# Patient Record
Sex: Female | Born: 1960 | Race: White | Hispanic: No | Marital: Married | Smoking: Never smoker
Health system: Southern US, Community
[De-identification: ages and names within clinical notes are randomized; demographics above are authoritative.]

## PROBLEM LIST (undated history)

## (undated) DIAGNOSIS — I639 Cerebral infarction, unspecified: Secondary | ICD-10-CM

## (undated) DIAGNOSIS — F419 Anxiety disorder, unspecified: Secondary | ICD-10-CM

## (undated) DIAGNOSIS — K219 Gastro-esophageal reflux disease without esophagitis: Secondary | ICD-10-CM

## (undated) DIAGNOSIS — I10 Essential (primary) hypertension: Secondary | ICD-10-CM

## (undated) DIAGNOSIS — E785 Hyperlipidemia, unspecified: Secondary | ICD-10-CM

## (undated) DIAGNOSIS — D259 Leiomyoma of uterus, unspecified: Secondary | ICD-10-CM

## (undated) HISTORY — DX: Anxiety disorder, unspecified: F41.9

## (undated) HISTORY — DX: Cerebral infarction, unspecified: I63.9

## (undated) HISTORY — DX: Gastro-esophageal reflux disease without esophagitis: K21.9

## (undated) HISTORY — DX: Leiomyoma of uterus, unspecified: D25.9

## (undated) HISTORY — DX: Hyperlipidemia, unspecified: E78.5

## (undated) HISTORY — PX: CHOLECYSTECTOMY: SHX55

## (undated) HISTORY — DX: Essential (primary) hypertension: I10

---

## 2006-07-28 HISTORY — PX: CHOLECYSTECTOMY, LAPAROSCOPIC: SHX56

## 2012-01-27 HISTORY — PX: ABLATION: SHX5711

## 2012-07-31 ENCOUNTER — Other Ambulatory Visit (HOSPITAL_COMMUNITY): Payer: Self-pay | Admitting: Specialist

## 2012-07-31 DIAGNOSIS — Z1231 Encounter for screening mammogram for malignant neoplasm of breast: Secondary | ICD-10-CM

## 2012-08-18 ENCOUNTER — Ambulatory Visit (HOSPITAL_COMMUNITY): Payer: Self-pay | Attending: Specialist

## 2013-10-20 ENCOUNTER — Encounter: Payer: Self-pay | Admitting: Internal Medicine

## 2013-12-10 ENCOUNTER — Ambulatory Visit (AMBULATORY_SURGERY_CENTER): Payer: Self-pay

## 2013-12-10 VITALS — Ht 66.0 in | Wt 210.0 lb

## 2013-12-10 DIAGNOSIS — Z1211 Encounter for screening for malignant neoplasm of colon: Secondary | ICD-10-CM

## 2013-12-10 MED ORDER — MOVIPREP 100 G PO SOLR: 1.0000 | Freq: Once | ORAL | Status: DC

## 2013-12-22 ENCOUNTER — Ambulatory Visit (AMBULATORY_SURGERY_CENTER): Payer: 59 | Admitting: Internal Medicine

## 2013-12-22 ENCOUNTER — Encounter: Payer: Self-pay | Admitting: Internal Medicine

## 2013-12-22 VITALS — BP 116/66 | HR 70 | Temp 98.4°F | Resp 11 | Ht 66.0 in | Wt 210.0 lb

## 2013-12-22 DIAGNOSIS — D126 Benign neoplasm of colon, unspecified: Secondary | ICD-10-CM

## 2013-12-22 DIAGNOSIS — Z1211 Encounter for screening for malignant neoplasm of colon: Secondary | ICD-10-CM

## 2013-12-22 MED ORDER — SODIUM CHLORIDE 0.9 % IV SOLN: 500.0000 mL | INTRAVENOUS | Status: DC

## 2013-12-22 NOTE — Progress Notes (Signed)
Called to room to assist during endoscopic procedure.  Patient ID and intended procedure confirmed with present staff. Received instructions for my participation in the procedure from the performing physician.  

## 2013-12-22 NOTE — Progress Notes (Signed)
Report to pacu rn, vss, bbs=clear 

## 2013-12-22 NOTE — Patient Instructions (Signed)
YOU HAD AN ENDOSCOPIC PROCEDURE TODAY AT THE [facility]: Refer to the procedure report that was given to you for any specific questions about what was found during the examination.  If the procedure report does not answer your questions, please call your gastroenterologist to clarify.  If you requested that your care partner not be given the details of your procedure findings, then the procedure report has been included in a sealed envelope for you to review at your convenience later.  YOU SHOULD EXPECT: Some feelings of bloating in the abdomen. Passage of more gas than usual.  Walking can help get rid of the air that was put into your GI tract during the procedure and reduce the bloating. If you had a lower endoscopy (such as a colonoscopy or flexible sigmoidoscopy) you may notice spotting of blood in your stool or on the toilet paper. If you underwent a bowel prep for your procedure, then you may not have a normal bowel movement for a few days.  DIET: Your first meal following the procedure should be a light meal and then it is ok to progress to your normal diet.  A half-sandwich or bowl of soup is an example of a good first meal.  Heavy or fried foods are harder to digest and may make you feel nauseous or bloated.  Likewise meals heavy in dairy and vegetables can cause extra gas to form and this can also increase the bloating.  Drink plenty of fluids but you should avoid alcoholic beverages for 24 hours.  ACTIVITY: Your care partner should take you home directly after the procedure.  You should plan to take it easy, moving slowly for the rest of the day.  You can resume normal activity the day after the procedure however you should NOT DRIVE or use heavy machinery for 24 hours (because of the sedation medicines used during the test).    SYMPTOMS TO REPORT IMMEDIATELY: A gastroenterologist can be reached at any hour.  During normal business hours, 8:30 AM to 5:00 PM Monday through Friday,  call [phone].  After hours and on weekends, please call the GI answering service at [phone] who will take a message and have the physician on call contact you.   Following lower endoscopy (colonoscopy or flexible sigmoidoscopy):  Excessive amounts of blood in the stool  Significant tenderness or worsening of abdominal pains  Swelling of the abdomen that is new, acute  Fever of 100F or higher    FOLLOW UP: If any biopsies were taken you will be contacted by phone or by letter within the next 1-3 weeks.  Call your gastroenterologist if you have not heard about the biopsies in 3 weeks.  Our staff will call the home number listed on your records the next business day following your procedure to check on you and address any questions or concerns that you may have at that time regarding the information given to you following your procedure. This is a courtesy call and so if there is no answer at the home number and we have not heard from you through the emergency physician on call, we will assume that you have returned to your regular daily activities without incident.  SIGNATURES/CONFIDENTIALITY: You and/or your care partner have signed paperwork which will be entered into your electronic medical record.  These signatures attest to the fact that that the information above on your After Visit Summary has been reviewed and is understood.  Full responsibility of the confidentiality   of this discharge information lies with you and/or your care-partner.  Polyp and high fiber information given.  Dr. Olevia Perches will advise you about recall for next colonoscopy after pathology report is reviewed.

## 2013-12-22 NOTE — Op Note (Signed)
[place]  [address] [place], [address]   COLONOSCOPY PROCEDURE REPORT  PATIENT: Margaret Stanton, Margaret Stanton  MR#: [id number] BIRTHDATE: Dec 03, 1960 , 52  yrs. old GENDER: Female ENDOSCOPIST: Lafayette Dragon, MD REFERRED QT:MAUQ PROCEDURE DATE:  12/22/2013 PROCEDURE:   Colonoscopy with cold biopsy polypectomy First Screening Colonoscopy - Avg.  risk and is 50 yrs.  old or older Yes.  Prior Negative Screening - Now for repeat screening. N/A  History of Adenoma - Now for follow-up colonoscopy & has been > or = to 3 yrs.  N/A  Polyps Removed Today? Yes. ASA CLASS:   Class II INDICATIONS:Average risk patient for colon cancer. MEDICATIONS: MAC sedation, administered by CRNA and propofol (Diprivan) 300mg  IV  DESCRIPTION OF PROCEDURE:   After the risks benefits and alternatives of the procedure were thoroughly explained, informed consent was obtained.  A digital rectal exam revealed no abnormalities of the rectum.   The LB [id number]  endoscope was introduced through the anus and advanced to the cecum, which was identified by both the appendix and ileocecal valve. No adverse events experienced.   The quality of the prep was excellent, using MoviPrep  The instrument was then slowly withdrawn as the colon was fully examined.      COLON FINDINGS: Three diminutive smooth sessile polyps were found in the ascending colon.  A polypectomy was performed with cold forceps.  The resection was complete and the polyp tissue was completely retrieved.  Retroflexed views revealed no abnormalities. The time to cecum=7 minutes 49 seconds.  Withdrawal time=8 minutes 23 seconds.  The scope was withdrawn and the procedure completed. COMPLICATIONS: There were no complications.  ENDOSCOPIC IMPRESSION: Three diminutive sessile polyps were found in the ascending colon; polypectomy was performed with cold forceps  RECOMMENDATIONS: 1.  Await pathology results 2.  high-fiber diet Recall  colonoscopy pending path report   eSigned:  Lafayette Dragon, MD 12/22/2013 10:17 AM   cc:

## 2013-12-24 ENCOUNTER — Telehealth: Payer: Self-pay | Admitting: *Deleted

## 2013-12-24 NOTE — Telephone Encounter (Signed)
  Follow up Call-  Call back number 12/22/2013  Post procedure Call Back phone  # [phone]  Permission to leave phone message Yes     Patient questions:  Do you have a fever, pain , or abdominal swelling? no Pain Score  0 *  Have you tolerated food without any problems? yes  Have you been able to return to your normal activities? yes  Do you have any questions about your discharge instructions: Diet   no Medications  no Follow up visit  no  Do you have questions or concerns about your Care? no  Actions: * If pain score is 4 or above: No action needed, pain <4.

## 2013-12-29 ENCOUNTER — Encounter: Payer: Self-pay | Admitting: *Deleted

## 2013-12-29 ENCOUNTER — Encounter: Payer: Self-pay | Admitting: Internal Medicine

## 2014-03-30 DIAGNOSIS — I10 Essential (primary) hypertension: Secondary | ICD-10-CM | POA: Insufficient documentation

## 2014-04-12 ENCOUNTER — Encounter: Payer: Self-pay | Admitting: Family Medicine

## 2014-04-12 DIAGNOSIS — K222 Esophageal obstruction: Secondary | ICD-10-CM | POA: Insufficient documentation

## 2014-04-21 ENCOUNTER — Ambulatory Visit (INDEPENDENT_AMBULATORY_CARE_PROVIDER_SITE_OTHER): Payer: 59 | Admitting: Family Medicine

## 2014-04-21 ENCOUNTER — Encounter: Payer: Self-pay | Admitting: Family Medicine

## 2014-04-21 VITALS — BP 131/87 | HR 97 | Ht 66.0 in | Wt 207.0 lb

## 2014-04-21 DIAGNOSIS — Z8673 Personal history of transient ischemic attack (TIA), and cerebral infarction without residual deficits: Secondary | ICD-10-CM | POA: Insufficient documentation

## 2014-04-21 DIAGNOSIS — I10 Essential (primary) hypertension: Secondary | ICD-10-CM | POA: Insufficient documentation

## 2014-04-21 DIAGNOSIS — F3289 Other specified depressive episodes: Secondary | ICD-10-CM

## 2014-04-21 DIAGNOSIS — K2100 Gastro-esophageal reflux disease with esophagitis, without bleeding: Secondary | ICD-10-CM

## 2014-04-21 DIAGNOSIS — K219 Gastro-esophageal reflux disease without esophagitis: Secondary | ICD-10-CM | POA: Insufficient documentation

## 2014-04-21 DIAGNOSIS — F32A Depression, unspecified: Secondary | ICD-10-CM | POA: Insufficient documentation

## 2014-04-21 DIAGNOSIS — F3342 Major depressive disorder, recurrent, in full remission: Secondary | ICD-10-CM | POA: Insufficient documentation

## 2014-04-21 DIAGNOSIS — E785 Hyperlipidemia, unspecified: Secondary | ICD-10-CM

## 2014-04-21 DIAGNOSIS — F329 Major depressive disorder, single episode, unspecified: Secondary | ICD-10-CM

## 2014-04-21 LAB — BASIC METABOLIC PANEL WITH GFR
BUN: 11 mg/dL (ref 6–23)
CALCIUM: 9 mg/dL (ref 8.4–10.5)
CO2: 31 mEq/L (ref 19–32)
CREATININE: 0.8 mg/dL (ref 0.50–1.10)
Chloride: 102 mEq/L (ref 96–112)
GFR, Est Non African American: 86 mL/min
GLUCOSE: 89 mg/dL (ref 70–99)
Potassium: 4 mEq/L (ref 3.5–5.3)
SODIUM: 140 meq/L (ref 135–145)

## 2014-04-21 LAB — LIPID PANEL
Cholesterol: 231 mg/dL — ABNORMAL HIGH (ref 0–200)
HDL: 72 mg/dL (ref >39)
LDL CALC: 146 mg/dL — AB (ref 0–99)
Total CHOL/HDL Ratio: 3.2 Ratio
Triglycerides: 65 mg/dL (ref <150)
VLDL: 13 mg/dL (ref 0–40)

## 2014-04-21 MED ORDER — ASPIRIN 325 MG PO TBEC: 325.0000 mg | DELAYED_RELEASE_TABLET | Freq: Every day | ORAL | Status: DC

## 2014-04-21 MED ORDER — AMLODIPINE BESY-BENAZEPRIL HCL 5-10 MG PO CAPS: 1.0000 | ORAL_CAPSULE | Freq: Every day | ORAL | Status: DC

## 2014-04-21 MED ORDER — ESCITALOPRAM OXALATE 10 MG PO TABS: 10.0000 mg | ORAL_TABLET | Freq: Every day | ORAL | Status: DC

## 2014-04-21 NOTE — Progress Notes (Signed)
CC: Margaret Stanton is a 53 y.o. 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 Symptoms were present on a daily basis until starting Lexapro a little over a year ago. There's been no thoughts went to herself or others. There's been no sleep disturbance, anxiety.  Reports a history of GERD with a recent endoscopy showing a hiatal hernia and esophageal stricture earlier this summer. She was started on Protonix twice a day and since then has had complete resolution of epigastric discomfort starting 2 weeks ago. She denies any abdominal pain, regurgitation, nor difficulty swallowing  Reports a history of hypertension found when she was around 32. Strong family history of hypertension on both sides of the family tree. She's been on Lotrel for a matter of years now reports blood pressures at home are consistently below 140/90.  On chart review she has a history of hyperlipidemia most recently checked in 2012, for the last year she has not been on statin. Her past medical history significant for a CVA in 2012 without any residual neurologic deficit.  I reviewed the note from her neurologist who mentions she should be on a full aspirin but does not make any mention of being on a statin.. patient denies any recent motor or sensory disturbances  Review of Systems - General ROS: negative for - chills, fever, night sweats, weight gain or weight loss Ophthalmic ROS: negative for - decreased vision Psychological ROS: negative for - anxiety or depression ENT ROS: negative for - hearing change, nasal congestion, tinnitus or allergies Hematological and Lymphatic ROS: negative for - bleeding problems, bruising or swollen lymph nodes Breast ROS: negative Respiratory ROS: no cough,  shortness of breath, or wheezing Cardiovascular ROS: no chest pain or dyspnea on exertion Gastrointestinal ROS: no abdominal pain, change in bowel habits, or black or bloody stools Genito-Urinary ROS: negative for - genital discharge, genital ulcers, incontinence or abnormal bleeding from genitals Musculoskeletal ROS: negative for - joint pain or muscle pain Neurological ROS: negative for - headaches or memory loss Dermatological ROS: negative for lumps, mole changes, rash and skin lesion changes  Past Medical History  Diagnosis Date  . Hypertension   . CVA (cerebral infarction)     2013    Past Surgical History  Procedure Laterality Date  . Ablation  01-2012    uterine  . Cholecystectomy, laparoscopic  07-2006  . Cholecystectomy     Family History  Problem Relation Age of Onset  . Colon cancer Neg Hx   . Pancreatic cancer Neg Hx   . Rectal cancer Neg Hx   . Stomach cancer Neg Hx   . Heart attack Father   . Diabetes Mother   . Hypertension      parents    History   Social History  . Marital Status: Married    Spouse Name: N/A    Number of Children: N/A  . Years of Education: N/A   Occupational History  . Not on file.   Social History Main Topics  . Smoking status: Never Smoker   . Smokeless tobacco: Never Used  . Alcohol Use: Yes     Comment: 1 glass of wine about every 2 months  . Drug Use: No  . Sexual Activity: Yes    Partners: Male  Other Topics Concern  . Not on file   Social History Narrative  . No narrative on file     Objective: BP 131/87  Pulse 97  Ht 5\' 6"  (1.676 m)  Wt 207 lb (93.895 kg)  BMI 33.43 kg/m2  General: Alert and Oriented, No Acute Distress HEENT: Pupils equal, round, reactive to light. Conjunctivae clear.  Moist membranes pharynx unremarkable Lungs: Clear to auscultation bilaterally, no wheezing/ronchi/rales.  Comfortable work of breathing. Good air movement. Cardiac: Regular rate and rhythm. Normal S1/S2.  No murmurs, rubs,  nor gallops.  No carotid bruit Abdomen: Obese and soft Extremities: No peripheral edema.  Strong peripheral pulses.  Mental Status: No depression, anxiety, nor agitation. Skin: Warm and dry.  Assessment & Plan: Margaret Stanton was seen today for establish care.  Diagnoses and associated orders for this visit:  Depression - escitalopram (LEXAPRO) 10 MG tablet; Take 1 tablet (10 mg total) by mouth daily.  Gastroesophageal reflux disease with esophagitis  Essential hypertension, benign - BASIC METABOLIC PANEL WITH GFR - aspirin 325 MG EC tablet; Take 1 tablet (325 mg total) by mouth daily. - amLODipine-benazepril (LOTREL) 5-10 MG per capsule; Take 1 capsule by mouth daily.  Hyperlipidemia LDL goal <100 - Lipid panel  History of CVA (cerebrovascular accident) without residual deficits    Depression: Controlled continue Lexapro GERD: Controlled continue Protonix twice a day Essential hypertension: Controlled checking renal function, continue Lotrel pending results Hyperlipidemia: Uncontrolled as of last cholesterol panel, recheck today History of CVA: Requesting further outside records to determine why she is not on a statin. Continue aspirin She's due for routine diabetic screening  Return in about 6 months (around 10/21/2014) for BP and Cholesterol Followup.

## 2014-04-22 ENCOUNTER — Telehealth: Payer: Self-pay | Admitting: Family Medicine

## 2014-04-22 DIAGNOSIS — E785 Hyperlipidemia, unspecified: Secondary | ICD-10-CM

## 2014-04-22 MED ORDER — ATORVASTATIN CALCIUM 20 MG PO TABS: 20.0000 mg | ORAL_TABLET | Freq: Every day | ORAL | Status: DC

## 2014-04-22 NOTE — Telephone Encounter (Signed)
Pt.notified

## 2014-04-22 NOTE — Telephone Encounter (Signed)
Seth Bake, Will you please let patient know that her fasting blood sugar and kidney function were normal.  Her LDL cholesterol was elevated at 146 with a goal of less than 100 given her history of a stroke.  Based on current guidelines I would recommend she start a daily cholesterol lowering medication called atorvastatin which I've sent to the [facility] outpatient pharmacy.  I'd encourage f/u in 3 months to recheck cholesterol.

## 2014-05-02 ENCOUNTER — Encounter: Payer: Self-pay | Admitting: Family Medicine

## 2014-05-02 DIAGNOSIS — G43909 Migraine, unspecified, not intractable, without status migrainosus: Secondary | ICD-10-CM | POA: Insufficient documentation

## 2014-05-13 LAB — HM MAMMOGRAPHY

## 2014-09-13 ENCOUNTER — Encounter: Payer: Self-pay | Admitting: Family Medicine

## 2014-09-13 DIAGNOSIS — K831 Obstruction of bile duct: Secondary | ICD-10-CM | POA: Insufficient documentation

## 2014-09-13 DIAGNOSIS — D3502 Benign neoplasm of left adrenal gland: Secondary | ICD-10-CM | POA: Insufficient documentation

## 2014-11-08 ENCOUNTER — Ambulatory Visit: Payer: 59 | Attending: Orthopedic Surgery | Admitting: Physical Therapy

## 2014-11-08 DIAGNOSIS — M25562 Pain in left knee: Secondary | ICD-10-CM | POA: Diagnosis present

## 2014-11-15 ENCOUNTER — Ambulatory Visit: Payer: 59 | Admitting: Physical Therapy

## 2014-11-15 DIAGNOSIS — M25562 Pain in left knee: Secondary | ICD-10-CM | POA: Diagnosis not present

## 2014-11-21 ENCOUNTER — Ambulatory Visit: Payer: 59 | Admitting: Physical Therapy

## 2014-11-21 DIAGNOSIS — M25562 Pain in left knee: Secondary | ICD-10-CM | POA: Diagnosis not present

## 2014-11-25 ENCOUNTER — Ambulatory Visit: Payer: 59 | Admitting: Physical Therapy

## 2015-02-07 ENCOUNTER — Encounter: Payer: Self-pay | Admitting: Family Medicine

## 2015-02-07 ENCOUNTER — Telehealth: Payer: Self-pay | Admitting: Family Medicine

## 2015-02-07 DIAGNOSIS — I10 Essential (primary) hypertension: Secondary | ICD-10-CM

## 2015-02-07 MED ORDER — AMLODIPINE BESY-BENAZEPRIL HCL 5-10 MG PO CAPS: 1.0000 | ORAL_CAPSULE | Freq: Every day | ORAL | Status: DC

## 2015-02-07 NOTE — Telephone Encounter (Signed)
Message left on vm 

## 2015-02-07 NOTE — Telephone Encounter (Signed)
Margaret Stanton, Will you please let patient know that I sent in enough Lotrel to last her until her follow up in June.

## 2015-04-24 ENCOUNTER — Encounter: Payer: Self-pay | Admitting: *Deleted

## 2015-05-15 ENCOUNTER — Ambulatory Visit (INDEPENDENT_AMBULATORY_CARE_PROVIDER_SITE_OTHER): Payer: 59 | Admitting: Family Medicine

## 2015-05-15 ENCOUNTER — Encounter: Payer: Self-pay | Admitting: Family Medicine

## 2015-05-15 VITALS — BP 125/89 | HR 88 | Ht 66.0 in | Wt 216.0 lb

## 2015-05-15 DIAGNOSIS — I10 Essential (primary) hypertension: Secondary | ICD-10-CM

## 2015-05-15 DIAGNOSIS — E785 Hyperlipidemia, unspecified: Secondary | ICD-10-CM

## 2015-05-15 DIAGNOSIS — Z8673 Personal history of transient ischemic attack (TIA), and cerebral infarction without residual deficits: Secondary | ICD-10-CM | POA: Diagnosis not present

## 2015-05-15 DIAGNOSIS — E669 Obesity, unspecified: Secondary | ICD-10-CM

## 2015-05-15 DIAGNOSIS — F32A Depression, unspecified: Secondary | ICD-10-CM

## 2015-05-15 DIAGNOSIS — K2100 Gastro-esophageal reflux disease with esophagitis, without bleeding: Secondary | ICD-10-CM

## 2015-05-15 DIAGNOSIS — F329 Major depressive disorder, single episode, unspecified: Secondary | ICD-10-CM

## 2015-05-15 LAB — LIPID PANEL
CHOLESTEROL: 220 mg/dL — AB (ref 0–200)
HDL: 71 mg/dL (ref >=46)
LDL CALC: 137 mg/dL — AB (ref 0–99)
TRIGLYCERIDES: 59 mg/dL (ref <150)
Total CHOL/HDL Ratio: 3.1 Ratio
VLDL: 12 mg/dL (ref 0–40)

## 2015-05-15 MED ORDER — PANTOPRAZOLE SODIUM 40 MG PO TBEC: 40.0000 mg | DELAYED_RELEASE_TABLET | Freq: Two times a day (BID) | ORAL | Status: DC

## 2015-05-15 MED ORDER — TOPIRAMATE 25 MG PO TABS: 25.0000 mg | ORAL_TABLET | Freq: Two times a day (BID) | ORAL | Status: DC

## 2015-05-15 MED ORDER — AMLODIPINE BESY-BENAZEPRIL HCL 5-10 MG PO CAPS: 1.0000 | ORAL_CAPSULE | Freq: Every day | ORAL | Status: DC

## 2015-05-15 MED ORDER — ESCITALOPRAM OXALATE 10 MG PO TABS: 10.0000 mg | ORAL_TABLET | Freq: Every day | ORAL | Status: DC

## 2015-05-15 NOTE — Progress Notes (Signed)
CC: Margaret Stanton is a 54 y.o. female is here for Follow-up   Subjective: HPI:  FU HTN: Continues to take lotrel on a daily basis w/out side effects.  No outside BPs to report.  No chest pain, shortness of breath, orthopnea, peripheral edema, nor motor or sensory disturbances.  FU HLD: continues to take lipitor on a daily basis. no RUQ pain nor myalgias. No limb claudication.  No formal exercise routine.  FU CVA: No new motor or sensory disturbances. Taking aspirin daily  FU GERD: REquesting refill on protonix.  Since esophageal dilatation last year no chest discomfort, dysphagia, nor suspicion of reflux/    FU Depression: Feels like lexapro is still working.  Stressed from work but not [employer].  No thoughts of wanting to harm self or others.   Wants to know if there is anything she can take to help reduce appetite. Not having success with this on her own.   Review Of Systems Outlined In HPI  Past Medical History  Diagnosis Date  . Hypertension   . CVA (cerebral infarction)     2013  . Uterine fibromyoma     Past Surgical History  Procedure Laterality Date  . Ablation  01-2012    uterine  . Cholecystectomy, laparoscopic  07-2006  . Cholecystectomy     Family History  Problem Relation Age of Onset  . Colon cancer Neg Hx   . Pancreatic cancer Neg Hx   . Rectal cancer Neg Hx   . Stomach cancer Neg Hx   . Heart attack Father   . Diabetes Mother   . Hypertension      parents    History   Social History  . Marital Status: Married    Spouse Name: N/A  . Number of Children: N/A  . Years of Education: N/A   Occupational History  . Not on file.   Social History Main Topics  . Smoking status: Never Smoker   . Smokeless tobacco: Never Used  . Alcohol Use: Yes     Comment: 1 glass of wine about every 2 months  . Drug Use: No  . Sexual Activity:    Partners: Male   Other Topics Concern  . Not on file   Social History Narrative  . No  narrative on file     Objective: BP 125/89 mmHg  Pulse 88  Ht 5\' 6"  (1.676 m)  Wt 216 lb (97.977 kg)  BMI 34.88 kg/m2  General: Alert and Oriented, No Acute Distress HEENT: Pupils equal, round, reactive to light. Conjunctivae clear.  Moist mucous membranes Lungs: Clear to auscultation bilaterally, no wheezing/ronchi/rales.  Comfortable work of breathing. Good air movement. Cardiac: Regular rate and rhythm. Normal S1/S2.  No murmurs, rubs, nor gallops.   Abdomen: Normal bowel sounds, soft and non tender without palpable masses. Extremities: No peripheral edema.  Strong peripheral pulses.  Mental Status: No depression, anxiety, nor agitation. Skin: Warm and dry.  Assessment & Plan: Margaret Stanton was seen today for follow-up.  Diagnoses and all orders for this visit:  Essential hypertension, benign Orders: -     amLODipine-benazepril (LOTREL) 5-10 MG per capsule; Take 1 capsule by mouth daily.  Hyperlipidemia LDL goal <100 Orders: -     Lipid panel  History of CVA (cerebrovascular accident) without residual deficits  Gastroesophageal reflux disease with esophagitis Orders: -     pantoprazole (PROTONIX) 40 MG tablet; Take 1 tablet (40 mg total) by mouth 2 (two) times daily.  Depression Orders: -     escitalopram (LEXAPRO) 10 MG tablet; Take 1 tablet (10 mg total) by mouth daily.  Obesity Orders: -     topiramate (TOPAMAX) 25 MG tablet; Take 1 tablet (25 mg total) by mouth 2 (two) times daily. To help with appetite suppression.   HTN: Controlled with lotrel HLD: Due for repeat lipid panel, continue lipitor pending results CVA: No residual deficits.  Continue HTN control and daily aspirin GERD: controlled with protonix Depression: Controlled with lexapro Obesity: Not a great candidate for phentermine therefore start low dose topamax.  Return in about 3 months (around 08/15/2015) for BP Follow Up.

## 2015-05-16 ENCOUNTER — Telehealth: Payer: Self-pay | Admitting: Family Medicine

## 2015-05-16 MED ORDER — ATORVASTATIN CALCIUM 40 MG PO TABS: 40.0000 mg | ORAL_TABLET | Freq: Every day | ORAL | Status: DC

## 2015-05-16 NOTE — Telephone Encounter (Signed)
Margaret Stanton, Will you please let patient know that her LDL cholesterol was 137 and I would recommend a goal of less than 100. To help achieve this I will send in a larger dose of atorvastatin to [facility].  The new dose is 40mg  daily.  I'd recommend a recheck in three months.

## 2015-05-16 NOTE — Telephone Encounter (Signed)
Pt.notified

## 2015-08-17 ENCOUNTER — Encounter: Payer: Self-pay | Admitting: Family Medicine

## 2015-08-17 ENCOUNTER — Ambulatory Visit (INDEPENDENT_AMBULATORY_CARE_PROVIDER_SITE_OTHER): Payer: 59 | Admitting: Family Medicine

## 2015-08-17 ENCOUNTER — Ambulatory Visit (INDEPENDENT_AMBULATORY_CARE_PROVIDER_SITE_OTHER): Payer: 59

## 2015-08-17 VITALS — BP 140/86 | HR 88 | Wt 218.0 lb

## 2015-08-17 DIAGNOSIS — S20212A Contusion of left front wall of thorax, initial encounter: Secondary | ICD-10-CM

## 2015-08-17 DIAGNOSIS — R0781 Pleurodynia: Secondary | ICD-10-CM

## 2015-08-17 HISTORY — DX: Contusion of left front wall of thorax, initial encounter: S20.212A

## 2015-08-17 NOTE — Progress Notes (Signed)
Margaret Stanton is a 54 y.o. female who presents to [facility]: [facility]  today for rib injury. Patient was in her normal state of health 3 days ago when she tripped and fell at work. She was holding a clipboard. The clipboard June due to her ribs when she fell. She notes pain in the left anterior ribs. This is worse when she lies flat on her back and with motion. She denies any shortness of breath fevers chills nausea vomiting or diarrhea. She's tried some over-the-counter medicines for pain which have helped some.   Past Medical History  Diagnosis Date  . Hypertension   . CVA (cerebral infarction)     2013  . Uterine fibromyoma    Past Surgical History  Procedure Laterality Date  . Ablation  01-2012    uterine  . Cholecystectomy, laparoscopic  07-2006  . Cholecystectomy     Social History  Substance Use Topics  . Smoking status: Never Smoker   . Smokeless tobacco: Never Used  . Alcohol Use: Yes     Comment: 1 glass of wine about every 2 months   family history includes Diabetes in her mother; Heart attack in her father; Hypertension in an other family member. There is no history of Colon cancer, Pancreatic cancer, Rectal cancer, or Stomach cancer.  ROS as above Medications: Current Outpatient Prescriptions  Medication Sig Dispense Refill  . amLODipine-benazepril (LOTREL) 5-10 MG per capsule Take 1 capsule by mouth daily. 90 capsule 3  . aspirin 325 MG EC tablet Take 1 tablet (325 mg total) by mouth daily. 90 tablet 3  . atorvastatin (LIPITOR) 40 MG tablet Take 1 tablet (40 mg total) by mouth daily. 90 tablet 1  . escitalopram (LEXAPRO) 10 MG tablet Take 1 tablet (10 mg total) by mouth daily. 90 tablet 3  . pantoprazole (PROTONIX) 40 MG tablet Take 1 tablet (40 mg total) by mouth 2 (two) times daily. 60 tablet 3   No current facility-administered medications for this visit.   No Known Allergies   Exam:  BP 140/86 mmHg  Pulse 88  Wt 218 lb (98.884  kg) Gen: Well NAD HEENT: EOMI,  MMM Lungs: Normal work of breathing. CTABL Chest wall: Tender palpation left inferior anterior ribs  Heart: RRR no MRG Abd: NABS, Soft. Nondistended, Nontender Exts: Brisk capillary refill, warm and well perfused.   Preliminary x-ray of left ribs no fractures. Awaiting formal radiology review.  No results found for this or any previous visit (from the past 24 hour(s)). No results found.   Please see individual assessment and plan sections.

## 2015-08-17 NOTE — Assessment & Plan Note (Signed)
Contusion. Awaiting x-ray results. Provided patient with incentive spirometer. She will continue ibuprofen at prescription strength doses as needed. Return with PCP as needed.

## 2015-08-17 NOTE — Patient Instructions (Signed)
Thank you for coming in today. Call or go to the emergency room if you get worse, have trouble breathing, have chest pains, or palpitations.  Use the incentive spirometer.  Return as needed.   Rib Contusion A rib contusion is a deep bruise on your rib area. Contusions are the result of a blunt trauma that causes bleeding and injury to the tissues under the skin. A rib contusion may involve bruising of the ribs and of the skin and muscles in the area. The skin overlying the contusion may turn blue, purple, or yellow. Minor injuries will give you a painless contusion, but more severe contusions may stay painful and swollen for a few weeks. CAUSES  A contusion is usually caused by a blow, trauma, or direct force to an area of the body. This often occurs while playing contact sports. SYMPTOMS  Swelling and redness of the injured area.  Discoloration of the injured area.  Tenderness and soreness of the injured area.  Pain with or without movement. DIAGNOSIS  The diagnosis can be made by taking a medical history and performing a physical exam. An X-ray, CT scan, or MRI may be needed to determine if there were any associated injuries, such as broken bones (fractures) or internal injuries. TREATMENT  Often, the best treatment for a rib contusion is rest. Icing or applying cold compresses to the injured area may help reduce swelling and inflammation. Deep breathing exercises may be recommended to reduce the risk of partial lung collapse and pneumonia. Over-the-counter or prescription medicines may also be recommended for pain control. HOME CARE INSTRUCTIONS   Apply ice to the injured area:  Put ice in a plastic bag.  Place a towel between your skin and the bag.  Leave the ice on for 20 minutes, 2-3 times per day.  Take medicines only as directed by your health care provider.  Rest the injured area. Avoid strenuous activity and any activities or movements that cause pain. Be careful during  activities and avoid bumping the injured area.  Perform deep-breathing exercises as directed by your health care provider.  Do not lift anything that is heavier than 5 lb (2.3 kg) until your health care provider approves.  Do not use any tobacco products, including cigarettes, chewing tobacco, or electronic cigarettes. If you need help quitting, ask your health care provider. SEEK MEDICAL CARE IF:   You have increased bruising or swelling.  You have pain that is not controlled with treatment.  You have a fever. SEEK IMMEDIATE MEDICAL CARE IF:   You have difficulty breathing or shortness of breath.  You develop a continual cough, or you cough up thick or bloody sputum.  You feel sick to your stomach (nauseous), you throw up (vomit), or you have abdominal pain.   This information is not intended to replace advice given to you by your health care provider. Make sure you discuss any questions you have with your health care provider.   Document Released: 07/09/2001 Document Revised: 11/04/2014 Document Reviewed: 07/26/2014 Elsevier Interactive Patient Education [employer].

## 2015-08-18 NOTE — Progress Notes (Signed)
Quick Note:  Rib xray shows no fracture ______

## 2015-09-25 ENCOUNTER — Ambulatory Visit (INDEPENDENT_AMBULATORY_CARE_PROVIDER_SITE_OTHER): Payer: 59 | Admitting: Family Medicine

## 2015-09-25 ENCOUNTER — Encounter: Payer: Self-pay | Admitting: Family Medicine

## 2015-09-25 VITALS — BP 124/89 | HR 114 | Temp 98.9°F | Wt 216.0 lb

## 2015-09-25 DIAGNOSIS — A499 Bacterial infection, unspecified: Secondary | ICD-10-CM | POA: Diagnosis not present

## 2015-09-25 DIAGNOSIS — B9689 Other specified bacterial agents as the cause of diseases classified elsewhere: Secondary | ICD-10-CM

## 2015-09-25 DIAGNOSIS — J329 Chronic sinusitis, unspecified: Secondary | ICD-10-CM

## 2015-09-25 MED ORDER — AMOXICILLIN-POT CLAVULANATE 500-125 MG PO TABS: ORAL_TABLET | ORAL | Status: AC

## 2015-09-25 NOTE — Progress Notes (Signed)
CC: Margaret Stanton is a 54 y.o. female is here for Sinusitis; Sore Throat; Ear Fullness; and Generalized Body Aches   Subjective: HPI:  Facial pressure in the forehead along with nasal congestion and postnasal drip. Symptoms of an present for 2 weeks and last night she developed chills, body aches and a sore throat. No interventions as of yet. Symptoms are worsening on daily basis. Worse first thing in the morning and late in the evening. Denies confusion, rash, difficulty swallowing, cough or shortness of breath.  Review Of Systems Outlined In HPI  Past Medical History  Diagnosis Date  . Hypertension   . CVA (cerebral infarction)     2013  . Uterine fibromyoma     Past Surgical History  Procedure Laterality Date  . Ablation  01-2012    uterine  . Cholecystectomy, laparoscopic  07-2006  . Cholecystectomy     Family History  Problem Relation Age of Onset  . Colon cancer Neg Hx   . Pancreatic cancer Neg Hx   . Rectal cancer Neg Hx   . Stomach cancer Neg Hx   . Heart attack Father   . Diabetes Mother   . Hypertension      parents    Social History   Social History  . Marital Status: Married    Spouse Name: N/A  . Number of Children: N/A  . Years of Education: N/A   Occupational History  . Not on file.   Social History Main Topics  . Smoking status: Never Smoker   . Smokeless tobacco: Never Used  . Alcohol Use: Yes     Comment: 1 glass of wine about every 2 months  . Drug Use: No  . Sexual Activity:    Partners: Male   Other Topics Concern  . Not on file   Social History Narrative     Objective: BP 124/89 mmHg  Pulse 114  Temp(Src) 98.9 F (37.2 C) (Oral)  Wt 216 lb (97.977 kg)  General: Alert and Oriented, No Acute Distress HEENT: Pupils equal, round, reactive to light. Conjunctivae clear.  External ears unremarkable, canals clear with intact TMs with appropriate landmarks.  Middle ear appears open without effusion. Pink inferior turbinates.  Moist  mucous membranes, pharynx without inflammation nor lesions.  Neck supple without palpable lymphadenopathy nor abnormal masses. Lungs: Clear to auscultation bilaterally, no wheezing/ronchi/rales.  Comfortable work of breathing. Good air movement. Extremities: No peripheral edema.  Strong peripheral pulses.  Mental Status: No depression, anxiety, nor agitation. Skin: Warm and dry.  Assessment & Plan: Lazette was seen today for sinusitis, sore throat, ear fullness and generalized body aches.  Diagnoses and all orders for this visit:  Bacterial sinusitis -     amoxicillin-clavulanate (AUGMENTIN) 500-125 MG tablet; Take one by mouth every 8 hours for ten total days.   Start Augmentin, call if no better by Wednesday, consider starting nasal saline washes.  Return if symptoms worsen or fail to improve.

## 2015-11-22 DIAGNOSIS — R928 Other abnormal and inconclusive findings on diagnostic imaging of breast: Secondary | ICD-10-CM | POA: Diagnosis not present

## 2015-11-22 DIAGNOSIS — N63 Unspecified lump in breast: Secondary | ICD-10-CM | POA: Diagnosis not present

## 2015-11-22 DIAGNOSIS — N6002 Solitary cyst of left breast: Secondary | ICD-10-CM | POA: Diagnosis not present

## 2015-11-22 DIAGNOSIS — N6001 Solitary cyst of right breast: Secondary | ICD-10-CM | POA: Diagnosis not present

## 2015-12-11 DIAGNOSIS — N63 Unspecified lump in breast: Secondary | ICD-10-CM | POA: Diagnosis not present

## 2015-12-11 DIAGNOSIS — N6001 Solitary cyst of right breast: Secondary | ICD-10-CM | POA: Diagnosis not present

## 2015-12-14 ENCOUNTER — Other Ambulatory Visit: Payer: Self-pay | Admitting: Family Medicine

## 2015-12-14 MED FILL — ESCITALOPRAM 10 MG TABLET: 10 | 90 days supply | Qty: 90 | Fill #2

## 2015-12-14 MED FILL — AMLODIPINE-BENAZEPRIL 5-10: 5-10 | 90 days supply | Qty: 90 | Fill #2

## 2016-02-23 MED FILL — PANTOPRAZOLE SOD DR 40 MG T: 40 | 90 days supply | Qty: 180 | Fill #0

## 2016-03-15 MED FILL — ESCITALOPRAM 10 MG TABLET: 10 | 90 days supply | Qty: 90 | Fill #3

## 2016-03-15 MED FILL — AMLODIPINE-BENAZEPRIL 5-10: 5-10 | 90 days supply | Qty: 90 | Fill #3

## 2016-04-17 ENCOUNTER — Ambulatory Visit (INDEPENDENT_AMBULATORY_CARE_PROVIDER_SITE_OTHER): Payer: 59 | Admitting: Family Medicine

## 2016-04-17 ENCOUNTER — Encounter: Payer: Self-pay | Admitting: Family Medicine

## 2016-04-17 VITALS — BP 128/84 | HR 76 | Wt 188.0 lb

## 2016-04-17 DIAGNOSIS — I10 Essential (primary) hypertension: Secondary | ICD-10-CM

## 2016-04-17 DIAGNOSIS — L918 Other hypertrophic disorders of the skin: Secondary | ICD-10-CM | POA: Diagnosis not present

## 2016-04-17 DIAGNOSIS — L089 Local infection of the skin and subcutaneous tissue, unspecified: Secondary | ICD-10-CM

## 2016-04-17 MED ORDER — AMLODIPINE BESY-BENAZEPRIL HCL 2.5-10 MG PO CAPS: 1.0000 | ORAL_CAPSULE | Freq: Every day | ORAL | Status: DC

## 2016-04-17 MED FILL — AMLODIPINE-BENAZEPRIL 2.5-1: 2.5-10 | 30 days supply | Qty: 30 | Fill #0

## 2016-04-17 NOTE — Progress Notes (Signed)
CC: Margaret Stanton is a 55 y.o. female is here for skin tags and Dizziness   Subjective: HPI:  Follow-up essential hypertension: She's lost 30 pounds intentionally over the last 3-4 months. She is doing weight watchers. She notices if she gets up quickly she gets lightheaded. It's causing dizziness. It never happens in any other situation and his mild severity. She's taking her Lotrel with 100% compliance. No outside blood pressures to report. Denies chest pain shortness of breath or any other motor or sensory disturbances  She's got some skin tags on her neck, left torso and on her right forearm. The tags on her neck and left torso caused intolerable pain when wearing a shirt. No interventions as of yet. They are getting larger on the monthly basis. Denies any bleeding or any other skin changes.   Review Of Systems Outlined In HPI  Past Medical History  Diagnosis Date  . Hypertension   . CVA (cerebral infarction)     2013  . Uterine fibromyoma     Past Surgical History  Procedure Laterality Date  . Ablation  01-2012    uterine  . Cholecystectomy, laparoscopic  07-2006  . Cholecystectomy     Family History  Problem Relation Age of Onset  . Colon cancer Neg Hx   . Pancreatic cancer Neg Hx   . Rectal cancer Neg Hx   . Stomach cancer Neg Hx   . Heart attack Father   . Diabetes Mother   . Hypertension      parents    Social History   Social History  . Marital Status: Married    Spouse Name: N/A  . Number of Children: N/A  . Years of Education: N/A   Occupational History  . Not on file.   Social History Main Topics  . Smoking status: Never Smoker   . Smokeless tobacco: Never Used  . Alcohol Use: Yes     Comment: 1 glass of wine about every 2 months  . Drug Use: No  . Sexual Activity:    Partners: Male   Other Topics Concern  . Not on file   Social History Narrative     Objective: BP 128/84 mmHg  Pulse 76  Wt 188 lb (85.276 kg)  Vital signs  reviewed. General: Alert and Oriented, No Acute Distress HEENT: Pupils equal, round, reactive to light. Conjunctivae clear.  External ears unremarkable.  Moist mucous membranes. Lungs: Clear and comfortable work of breathing, speaking in full sentences without accessory muscle use. Cardiac: Regular rate and rhythm.  Neuro: CN II-XII grossly intact, gait normal. Extremities: No peripheral edema.  Strong peripheral pulses.  Mental Status: No depression, anxiety, nor agitation. Logical though process. Skin: Warm and dry. Inflamed skin tag on the left side of the neck and on the left torso, noninflamed skin tags on the right forearm  Assessment & Plan: Margaret Stanton was seen today for skin tags and dizziness.  Diagnoses and all orders for this visit:  Essential hypertension, benign  Inflamed skin tag  Other orders -     amlodipine-benazepril (LOTREL) 2.5-10 MG capsule; Take 1 capsule by mouth daily.   Essential hypertension: Controlled however a little too aggressive with her dose therefore cutting back in reevaluating this in 4 weeks and she has follow-up. Skin tags: I offered cryotherapy versus excision she would like cryotherapy to the small skin tags and shave biopsy to the large one on her torso.   Cryotherapy Procedure Note  Pre-operative Diagnosis: skin tag  Post-operative Diagnosis: same  Locations: {left neck  Indications: pain  Anesthesia: None  Procedure Details  History of allergy to iodine: no. Pacemaker? no.  Patient informed of risks (permanent scarring, infection, light or dark discoloration, bleeding, infection, weakness, numbness and recurrence of the lesion) and benefits of the procedure and verbal informed consent obtained.  The areas are treated with liquid nitrogen therapy, frozen until ice ball extended 2 mm beyond lesion, allowed to thaw, and treated again. The patient tolerated procedure well.  The patient was instructed on post-op care, warned that there may  be blister formation, redness and pain. Recommend OTC analgesia as needed for pain.  Condition: Stable  Complications: none.  Plan: 1. Instructed to keep the area dry and covered for 24-48h and clean thereafter. 2. Warning signs of infection were reviewed.   3. Recommended that the patient use OTC analgesics as needed for pain.  4. Return PRN  Shave Biopsy Procedure Note  Pre-operative Diagnosis: inflammed skin tag  Post-operative Diagnosis: same  Locations:left flank  Indications: pain  Anesthesia: Aerosol cold spray  Procedure Details  History of allergy to iodine: no  Patient informed of the risks (including bleeding and infection) and benefits of the  procedure and Verbal informed consent obtained.  The lesion and surrounding area were given a sterile prep using chlorhexidine and draped in the usual sterile fashion. A scalpel was used to shave an area of skin approximately 0.6cm by 0.1cm.  Hemostasis achieved with alumuninum chloride. Antibiotic ointment and a sterile dressing applied.  The specimen was not sent for pathologic examination. The patient tolerated the procedure well.  EBL: 1 ml  Findings: none  Condition: Stable  Complications: none.  Plan: 1. Instructed to keep the wound dry and covered for 24-48h and clean thereafter. 2. Warning signs of infection were reviewed.   3. Recommended that the patient use OTC analgesics as needed for pain.  4. Return PRN  25 minutes spent face-to-face during visit today of which at least 50% was counseling or coordinating care regarding: 1. Essential hypertension, benign   2. Inflamed skin tag     Return if symptoms worsen or fail to improve.

## 2016-05-09 DIAGNOSIS — H5213 Myopia, bilateral: Secondary | ICD-10-CM | POA: Diagnosis not present

## 2016-05-09 DIAGNOSIS — H524 Presbyopia: Secondary | ICD-10-CM | POA: Diagnosis not present

## 2016-05-13 ENCOUNTER — Ambulatory Visit (INDEPENDENT_AMBULATORY_CARE_PROVIDER_SITE_OTHER): Payer: 59 | Admitting: Family Medicine

## 2016-05-13 ENCOUNTER — Encounter: Payer: Self-pay | Admitting: Family Medicine

## 2016-05-13 VITALS — BP 124/84 | HR 69 | Wt 185.0 lb

## 2016-05-13 DIAGNOSIS — F32A Depression, unspecified: Secondary | ICD-10-CM

## 2016-05-13 DIAGNOSIS — Z0000 Encounter for general adult medical examination without abnormal findings: Secondary | ICD-10-CM | POA: Diagnosis not present

## 2016-05-13 DIAGNOSIS — F329 Major depressive disorder, single episode, unspecified: Secondary | ICD-10-CM | POA: Diagnosis not present

## 2016-05-13 MED ORDER — PANTOPRAZOLE SODIUM 40 MG PO TBEC
DELAYED_RELEASE_TABLET | ORAL | Status: DC
Start: 2016-05-13 — End: 2017-01-20

## 2016-05-13 MED ORDER — AMLODIPINE BESY-BENAZEPRIL HCL 2.5-10 MG PO CAPS: 1.0000 | ORAL_CAPSULE | Freq: Every day | ORAL | Status: DC

## 2016-05-13 MED ORDER — ESCITALOPRAM OXALATE 10 MG PO TABS: 10.0000 mg | ORAL_TABLET | Freq: Every day | ORAL | Status: DC

## 2016-05-13 MED ORDER — ATORVASTATIN CALCIUM 40 MG PO TABS: 40.0000 mg | ORAL_TABLET | Freq: Every day | ORAL | Status: DC

## 2016-05-13 MED FILL — PANTOPRAZOLE SOD DR 40 MG T: 40 | 90 days supply | Qty: 180 | Fill #0

## 2016-05-13 MED FILL — AMLODIPINE-BENAZEPRIL 2.5-1: 2.5-10 | 90 days supply | Qty: 90 | Fill #0

## 2016-05-13 MED FILL — ESCITALOPRAM 10 MG TABLET: 10 | 90 days supply | Qty: 90 | Fill #0

## 2016-05-13 NOTE — Progress Notes (Signed)
CC: Margaret Stanton is a 55 y.o. female is here for Annual Exam   Subjective: HPI:  Colonoscopy: Due for repeat 2025 Papsmear: UTD Mammogram: Due for repeat US and Mammo 05/2016  Influenza Vaccine: No current indication Pneumovax: No current indication Td/Tdap:March 28th 2012 UTD Zoster: (Start 53 yo)   Requesting complete physical exam, ever since changing her blood pressure medication she's no longer having any lightheadedness.  Review of Systems - General ROS: negative for - chills, fever, night sweats, weight gain or weight loss Ophthalmic ROS: negative for - decreased vision Psychological ROS: negative for - anxiety or depression ENT ROS: negative for - hearing change, nasal congestion, tinnitus or allergies Hematological and Lymphatic ROS: negative for - bleeding problems, bruising or swollen lymph nodes Breast ROS: negative Respiratory ROS: no cough, shortness of breath, or wheezing Cardiovascular ROS: no chest pain or dyspnea on exertion Gastrointestinal ROS: no abdominal pain, change in bowel habits, or black or bloody stools Genito-Urinary ROS: negative for - genital discharge, genital ulcers, incontinence or abnormal bleeding from genitals Musculoskeletal ROS: negative for - joint pain or muscle pain Neurological ROS: negative for - headaches or memory loss Dermatological ROS: negative for lumps, mole changes, rash and skin lesion changes   Review Of Systems Outlined In HPI  Past Medical History  Diagnosis Date  . Hypertension   . CVA (cerebral infarction)     2013  . Uterine fibromyoma     Past Surgical History  Procedure Laterality Date  . Ablation  01-2012    uterine  . Cholecystectomy, laparoscopic  07-2006  . Cholecystectomy     Family History  Problem Relation Age of Onset  . Colon cancer Neg Hx   . Pancreatic cancer Neg Hx   . Rectal cancer Neg Hx   . Stomach cancer Neg Hx   . Heart attack Father   . Diabetes Mother   . Hypertension      parents     Social History   Social History  . Marital Status: Married    Spouse Name: N/A  . Number of Children: N/A  . Years of Education: N/A   Occupational History  . Not on file.   Social History Main Topics  . Smoking status: Never Smoker   . Smokeless tobacco: Never Used  . Alcohol Use: Yes     Comment: 1 glass of wine about every 2 months  . Drug Use: No  . Sexual Activity:    Partners: Male   Other Topics Concern  . Not on file   Social History Narrative     Objective: BP 124/84 mmHg  Pulse 69  Wt 185 lb (83.915 kg)  General: No Acute Distress HEENT: Atraumatic, normocephalic, conjunctivae normal without scleral icterus.  No nasal discharge, hearing grossly intact, TMs with good landmarks bilaterally with no middle ear abnormalities, posterior pharynx clear without oral lesions. Neck: Supple, trachea midline, no cervical nor supraclavicular adenopathy. Pulmonary: Clear to auscultation bilaterally without wheezing, rhonchi, nor rales. Cardiac: Regular rate and rhythm.  No murmurs, rubs, nor gallops. No peripheral edema.  2+ peripheral pulses bilaterally. Abdomen: Bowel sounds normal.  No masses.  Non-tender without rebound.  Negative Murphy's sign. MSK: Grossly intact, no signs of weakness.  Full strength throughout upper and lower extremities.  Full ROM in upper and lower extremities.  No midline spinal tenderness. Neuro: Gait unremarkable, CN II-XII grossly intact.  C5-C6 Reflex 2/4 Bilaterally, L4 Reflex 2/4 Bilaterally.  Cerebellar function intact. Skin: No rashes. Psych:  Alert and oriented to person/place/time.  Thought process normal. No anxiety/depression.   Assessment & Plan: Margaret Stanton was seen today for annual exam.  Diagnoses and all orders for this visit:  Depression -     escitalopram (LEXAPRO) 10 MG tablet; Take 1 tablet (10 mg total) by mouth daily.  Annual physical exam -     Lipid panel -     COMPLETE METABOLIC PANEL WITH GFR -     CBC -      Hepatitis C antibody  Other orders -     amlodipine-benazepril (LOTREL) 2.5-10 MG capsule; Take 1 capsule by mouth daily. -     atorvastatin (LIPITOR) 40 MG tablet; Take 1 tablet (40 mg total) by mouth daily. -     pantoprazole (PROTONIX) 40 MG tablet; TAKE 1 TABLET (40 MG TOTAL) BY MOUTH 2 (TWO) TIMES DAILY.   Healthy lifestyle interventions including but not limited to regular exercise, a healthy low fat diet, moderation of salt intake, the dangers of tobacco/alcohol/recreational drug use, nutrition supplementation, and accident avoidance were discussed with the patient and a handout was provided for future reference. She's due for the above refills.  Discussed with this patient that I will be resigning from my position here with [facility] in September in order to stay with my family who will be moving to [facility]. I let him know about the providers that are still accepting patients and I feel that this individual will be under great care if he/she stays here with [facility].  Return if symptoms worsen or fail to improve.

## 2016-05-14 ENCOUNTER — Telehealth: Payer: Self-pay | Admitting: Family Medicine

## 2016-05-14 DIAGNOSIS — Z8673 Personal history of transient ischemic attack (TIA), and cerebral infarction without residual deficits: Secondary | ICD-10-CM | POA: Diagnosis not present

## 2016-05-14 DIAGNOSIS — D649 Anemia, unspecified: Secondary | ICD-10-CM | POA: Diagnosis not present

## 2016-05-14 LAB — CBC
HEMATOCRIT: 32.4 % — AB (ref 35.0–45.0)
HEMOGLOBIN: 10.8 g/dL — AB (ref 11.7–15.5)
MCH: 30 pg (ref 27.0–33.0)
MCHC: 33.3 g/dL (ref 32.0–36.0)
MCV: 90 fL (ref 80.0–100.0)
MPV: 10.7 fL (ref 7.5–12.5)
Platelets: 254 10*3/uL (ref 140–400)
RBC: 3.6 MIL/uL — ABNORMAL LOW (ref 3.80–5.10)
RDW: 14.5 % (ref 11.0–15.0)
WBC: 6.1 10*3/uL (ref 3.8–10.8)

## 2016-05-14 LAB — COMPLETE METABOLIC PANEL WITH GFR
ALT: 23 U/L (ref 6–29)
AST: 29 U/L (ref 10–35)
Albumin: 4.1 g/dL (ref 3.6–5.1)
Alkaline Phosphatase: 84 U/L (ref 33–130)
BUN: 13 mg/dL (ref 7–25)
CALCIUM: 8.9 mg/dL (ref 8.6–10.4)
CHLORIDE: 102 mmol/L (ref 98–110)
CO2: 27 mmol/L (ref 20–31)
Creat: 0.88 mg/dL (ref 0.50–1.05)
GFR, Est African American: 87 mL/min (ref >=60)
GFR, Est Non African American: 75 mL/min (ref >=60)
Glucose, Bld: 87 mg/dL (ref 65–99)
POTASSIUM: 4.2 mmol/L (ref 3.5–5.3)
SODIUM: 141 mmol/L (ref 135–146)
Total Bilirubin: 0.4 mg/dL (ref 0.2–1.2)
Total Protein: 7 g/dL (ref 6.1–8.1)

## 2016-05-14 LAB — LIPID PANEL
CHOL/HDL RATIO: 3.1 ratio (ref <=5.0)
CHOLESTEROL: 210 mg/dL — AB (ref 125–200)
HDL: 68 mg/dL (ref >=46)
LDL Cholesterol: 130 mg/dL — ABNORMAL HIGH (ref <130)
Triglycerides: 61 mg/dL (ref <150)
VLDL: 12 mg/dL (ref <30)

## 2016-05-14 LAB — HEPATITIS C ANTIBODY: HCV Ab: NEGATIVE

## 2016-05-14 MED ORDER — ATORVASTATIN CALCIUM 40 MG PO TABS: 60.0000 mg | ORAL_TABLET | Freq: Every day | ORAL | Status: DC

## 2016-05-14 MED FILL — ATORVASTATIN 40 MG TABLET: 40 | 90 days supply | Qty: 135 | Fill #0

## 2016-05-14 NOTE — Telephone Encounter (Signed)
Will you please let patient know that her LDL cholesterol is moderately elevated and not at goal. I recommend she increase her atorvastatin to 1.5 tablets a day, have updated her prescription and sent this update to the pharmacy at the med center in [facility]. Also her hemoglobin was in the anemic range and I would recommend that she have her iron, [id number] and folic acid levels checked to see if this can be corrected with a supplement. (Order senior in box if not able to add on to yesterday's blood draw.)

## 2016-05-14 NOTE — Telephone Encounter (Signed)
Pt notified  Labs added

## 2016-05-15 LAB — FOLATE: FOLATE: 5.6 ng/mL (ref >5.4)

## 2016-05-15 LAB — IRON AND TIBC
%SAT: 19 % (ref 11–50)
IRON: 54 ug/dL (ref 45–160)
TIBC: 289 ug/dL (ref 250–450)
UIBC: 235 ug/dL (ref 125–400)

## 2016-05-15 LAB — VITAMIN B12: VITAMIN B 12: 450 pg/mL (ref 200–1100)

## 2016-06-27 ENCOUNTER — Encounter: Payer: Self-pay | Admitting: Osteopathic Medicine

## 2016-06-27 ENCOUNTER — Ambulatory Visit (INDEPENDENT_AMBULATORY_CARE_PROVIDER_SITE_OTHER): Payer: 59 | Admitting: Osteopathic Medicine

## 2016-06-27 VITALS — BP 135/86 | HR 75 | Ht 66.0 in | Wt 188.0 lb

## 2016-06-27 DIAGNOSIS — L237 Allergic contact dermatitis due to plants, except food: Secondary | ICD-10-CM

## 2016-06-27 MED ORDER — METHYLPREDNISOLONE SODIUM SUCC 125 MG IJ SOLR
125.0000 mg | Freq: Once | INTRAMUSCULAR | Status: AC
  Administered 2016-06-27: 125 mg via INTRAMUSCULAR

## 2016-06-27 MED ORDER — HYDROXYZINE HCL 50 MG PO TABS
25.0000 mg | ORAL_TABLET | Freq: Four times a day (QID) | ORAL | 3 refills | Status: DC | PRN
Start: 2016-06-27 — End: 2016-09-18

## 2016-06-27 MED ORDER — MOMETASONE FUROATE 0.1 % EX OINT: TOPICAL_OINTMENT | Freq: Two times a day (BID) | CUTANEOUS | 0 refills | Status: DC | PRN

## 2016-06-27 MED FILL — hydrOXYzine HCL 50 MG TABS: 50 | 7 days supply | Qty: 30 | Fill #0

## 2016-06-27 MED FILL — MOMETASONE FUROATE 0.1% OIN: 0.1 | 10 days supply | Qty: 15 | Fill #0

## 2016-06-27 NOTE — Progress Notes (Signed)
HPI: Margaret Stanton is a 55 y.o. female  who presents to [place] today, 06/27/16,  for chief complaint of:  Chief Complaint  Patient presents with  . Poison Ivy    . Context: Working on yard and came into contact with [place] . Quality: Extremely itchy . Modifying factors: has already tried Cortisone OTC which helps for a few minutes but then doesn't help. Has also tried Clorox on it and this hasn't helped.    Past medical, surgical, social and family history reviewed: Past Medical History:  Diagnosis Date  . CVA (cerebral infarction)    2013  . Hypertension   . Uterine fibromyoma    Past Surgical History:  Procedure Laterality Date  . ABLATION  01-2012   uterine  . CHOLECYSTECTOMY    . CHOLECYSTECTOMY, LAPAROSCOPIC  07-2006   Social History  Substance Use Topics  . Smoking status: Never Smoker  . Smokeless tobacco: Never Used  . Alcohol use Yes     Comment: 1 glass of wine about every 2 months   Family History  Problem Relation Age of Onset  . Colon cancer Neg Hx   . Pancreatic cancer Neg Hx   . Rectal cancer Neg Hx   . Stomach cancer Neg Hx   . Heart attack Father   . Diabetes Mother   . Hypertension      parents     Current medication list and allergy/intolerance information reviewed:   Current Outpatient Prescriptions  Medication Sig Dispense Refill  . amlodipine-benazepril (LOTREL) 2.5-10 MG capsule Take 1 capsule by mouth daily. 90 capsule 1  . aspirin 325 MG EC tablet Take 1 tablet (325 mg total) by mouth daily. 90 tablet 3  . atorvastatin (LIPITOR) 40 MG tablet Take 1.5 tablets (60 mg total) by mouth daily. 135 tablet 0  . escitalopram (LEXAPRO) 10 MG tablet Take 1 tablet (10 mg total) by mouth daily. 90 tablet 0  . pantoprazole (PROTONIX) 40 MG tablet TAKE 1 TABLET (40 MG TOTAL) BY MOUTH 2 (TWO) TIMES DAILY. 180 tablet 1   No current facility-administered medications for this visit.    No Known Allergies     Review of Systems:  Constitutional:  No  fever, no chills, No recent illness, No unintentional weight changes. No significant fatigue.   Musculoskeletal: No new myalgia/arthralgia  Skin: (+)Rash, No other wounds/concerning lesions  Exam:  BP 135/86   Pulse 75   Ht 5\' 6"  (1.676 m)   Wt 188 lb (85.3 kg)   BMI 30.34 kg/m   Constitutional: VS see above. General Appearance: alert, well-developed, well-nourished, NAD  Eyes: Normal lids and conjunctive, non-icteric sclera  Ears, Nose, Mouth, Throat: MMM,   Neck: No masses, trachea midline.   Musculoskeletal: Gait normal. No clubbing/cyanosis of digits.   Neurological: Normal balance/coordination.   Skin: warm, dry, intact. Urticarial poison ivy contact dermatitis changes noted on right and left upper extremity, right lower extremity. No concerning nevi or subq nodules on limited exam.      ASSESSMENT/PLAN: UpToDate info printed, Rx as below for sypmtom control, avoidance dicsussed   Poison ivy dermatitis - Plan: mometasone (ELOCON) 0.1 % ointment, hydrOXYzine (ATARAX/VISTARIL) 50 MG tablet, methylPREDNISolone sodium succinate (SOLU-MEDROL) 125 mg/2 mL injection 125 mg   Visit summary with medication list and pertinent instructions was printed for patient to review. All questions at time of visit were answered - patient instructed to contact office with any additional concerns. ER/RTC precautions were reviewed  with the patient. Follow-up plan: Return if symptoms worsen or fail to improve.

## 2016-07-01 DIAGNOSIS — L239 Allergic contact dermatitis, unspecified cause: Secondary | ICD-10-CM | POA: Diagnosis not present

## 2016-07-01 DIAGNOSIS — R21 Rash and other nonspecific skin eruption: Secondary | ICD-10-CM | POA: Diagnosis not present

## 2016-08-09 DIAGNOSIS — N6011 Diffuse cystic mastopathy of right breast: Secondary | ICD-10-CM | POA: Diagnosis not present

## 2016-08-09 DIAGNOSIS — R928 Other abnormal and inconclusive findings on diagnostic imaging of breast: Secondary | ICD-10-CM | POA: Diagnosis not present

## 2016-08-27 MED FILL — AMLODIPINE-BENAZEPRIL 2.5-1: 2.5-10 | 90 days supply | Qty: 90 | Fill #1

## 2016-08-27 MED FILL — PANTOPRAZOLE SOD DR 40 MG T: 40 | 90 days supply | Qty: 180 | Fill #1

## 2016-08-30 ENCOUNTER — Other Ambulatory Visit: Payer: Self-pay | Admitting: *Deleted

## 2016-08-30 MED ORDER — ESCITALOPRAM OXALATE 10 MG PO TABS: 10.0000 mg | ORAL_TABLET | Freq: Every day | ORAL | 0 refills | Status: DC

## 2016-08-30 NOTE — Progress Notes (Signed)
Refilled meds at physical back in July but needs appt to specifically f/u depression.

## 2016-09-11 MED FILL — ESCITALOPRAM 10 MG TABLET: 10 | 30 days supply | Qty: 30 | Fill #0

## 2016-09-18 ENCOUNTER — Encounter: Payer: Self-pay | Admitting: Osteopathic Medicine

## 2016-09-18 ENCOUNTER — Ambulatory Visit (INDEPENDENT_AMBULATORY_CARE_PROVIDER_SITE_OTHER): Payer: 59 | Admitting: Osteopathic Medicine

## 2016-09-18 VITALS — BP 132/86 | HR 85 | Ht 66.0 in | Wt 197.0 lb

## 2016-09-18 DIAGNOSIS — K2100 Gastro-esophageal reflux disease with esophagitis, without bleeding: Secondary | ICD-10-CM

## 2016-09-18 DIAGNOSIS — F3342 Major depressive disorder, recurrent, in full remission: Secondary | ICD-10-CM | POA: Diagnosis not present

## 2016-09-18 DIAGNOSIS — Z8673 Personal history of transient ischemic attack (TIA), and cerebral infarction without residual deficits: Secondary | ICD-10-CM | POA: Diagnosis not present

## 2016-09-18 DIAGNOSIS — I10 Essential (primary) hypertension: Secondary | ICD-10-CM | POA: Diagnosis not present

## 2016-09-18 MED ORDER — RANITIDINE HCL 300 MG PO TABS: 300.0000 mg | ORAL_TABLET | Freq: Every day | ORAL | 1 refills | Status: DC

## 2016-09-18 MED ORDER — ESCITALOPRAM OXALATE 10 MG PO TABS: 10.0000 mg | ORAL_TABLET | Freq: Every day | ORAL | 3 refills | Status: DC

## 2016-09-18 NOTE — Progress Notes (Signed)
HPI: Margaret Stanton is a 55 y.o. female  who presents to [facility] today, 09/18/16,  for chief complaint of:  Chief Complaint  Patient presents with  . Establish Care    switch from [place]/ medication refill     Hypertension: Doing well on current medications. Dose was adjusted due to orthostatic dizziness but since this is been lowered she's been doing well. No chest pain, pressure, shortness of breath  GERD: Has been on Protonix for several years, has been reading about this medication is concerned about long-term side effects. Finds though that she has not been able to come off of the medication, still have heartburn particularly in the evenings/at night  History of CVA: Previously seen at [facility]. On statin, high-dose aspirin per recommendation of neurology. Blood pressure is well controlled. No residual effects/deficits.   Depression: Stable on Lexapro, would like to continue this medication.   Past medical, surgical, social and family history reviewed: Patient Active Problem List   Diagnosis Date Noted  . Anemia 05/14/2016  . Contusion of rib on left side 08/17/2015  . 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 Common bile duct (CBD) obstruction 09/13/2014  . Migraines 05/02/2014  . Essential hypertension, benign 04/21/2014  . Hyperlipidemia LDL goal <100 04/21/2014  . History of CVA (cerebrovascular accident) without residual deficits 04/21/2014  . Depression 04/21/2014  . GERD (gastroesophageal reflux disease) 04/21/2014  . Stricture and stenosis of esophagus 04/12/2014   Past Surgical History:  Procedure Laterality Date  . ABLATION  01-2012   uterine  . CHOLECYSTECTOMY    . CHOLECYSTECTOMY, LAPAROSCOPIC  07-2006   Social History  Substance Use Topics  . Smoking status: Never Smoker  . Smokeless tobacco: Never Used  . Alcohol use Yes     Comment: 1 glass of wine about every 2 months   Family History  Problem Relation Age of  Onset  . Heart attack Father   . Diabetes Mother   . Hypertension      parents  . Colon cancer Neg Hx   . Pancreatic cancer Neg Hx   . Rectal cancer Neg Hx   . Stomach cancer Neg Hx      Current medication list and allergy/intolerance information reviewed:   Current Outpatient Prescriptions on File Prior to Visit  Medication Sig Dispense Refill  . amlodipine-benazepril (LOTREL) 2.5-10 MG capsule Take 1 capsule by mouth daily. 90 capsule 1  . aspirin 325 MG EC tablet Take 1 tablet (325 mg total) by mouth daily. 90 tablet 3  . atorvastatin (LIPITOR) 40 MG tablet Take 1.5 tablets (60 mg total) by mouth daily. 135 tablet 0  . escitalopram (LEXAPRO) 10 MG tablet Take 1 tablet (10 mg total) by mouth daily. 30 tablet 0  . pantoprazole (PROTONIX) 40 MG tablet TAKE 1 TABLET (40 MG TOTAL) BY MOUTH 2 (TWO) TIMES DAILY. 180 tablet 1   No current facility-administered medications on file prior to visit.    No Known Allergies    Review of Systems:  Constitutional: No recent illness  HEENT: No  headache, no vision change  Cardiac: No  chest pain, No  pressure, No palpitations  Respiratory:  No  shortness of breath. No  Cough  Gastrointestinal: No  abdominal pain, no change on bowel habits  Musculoskeletal: No new myalgia/arthralgia  Skin: No  Rash  Hem/Onc: No  easy bruising/bleeding, No  abnormal lumps/bumps  Neurologic: No  weakness, No  Dizziness  Psychiatric: No  concerns with depression, No  concerns with anxiety  Exam:  BP 132/86   Pulse 85   Ht 5\' 6"  (1.676 m)   Wt 197 lb (89.4 kg)   BMI 31.80 kg/m   Constitutional: VS see above. General Appearance: alert, well-developed, well-nourished, NAD  Eyes: Normal lids and conjunctive, non-icteric sclera  Ears, Nose, Mouth, Throat: MMM, Normal external inspection ears/nares/mouth/lips/gums.  Neck: No masses, trachea midline.   Respiratory: Normal respiratory effort. no wheeze, no rhonchi, no rales  Cardiovascular:  S1/S2 normal, no murmur, no rub/gallop auscultated. RRR.   Musculoskeletal: Gait normal. Symmetric and independent movement of all extremities  Neurological: Normal balance/coordination. No tremor.  Skin: warm, dry, intact.   Psychiatric: Normal judgment/insight. Normal mood and affect. Oriented x3.      ASSESSMENT/PLAN:   Recurrent major depressive disorder, in full remission ([place]) - 90 day prescription for Lexapro written. - Plan: escitalopram (LEXAPRO) 10 MG tablet  Gastroesophageal reflux disease with esophagitis - Transition to Zantac, taper down on PPI  Essential hypertension, benign - Controlled on current medication  History of CVA (cerebrovascular accident) without residual deficits - No residual deficits, continue high-dose aspirin and statin    Visit summary with medication list and pertinent instructions was printed for patient to review. All questions at time of visit were answered - patient instructed to contact office with any additional concerns. ER/RTC precautions were reviewed with the patient. Follow-up plan: Return in about 8 months (around 05/18/2017) for [place], sooner if needed.

## 2016-09-23 ENCOUNTER — Ambulatory Visit: Payer: Self-pay | Admitting: Family Medicine

## 2016-10-17 MED FILL — ESCITALOPRAM 10 MG TABLET: 10 | 90 days supply | Qty: 90 | Fill #0

## 2016-11-05 MED FILL — CYCLOBENZAPRINE 10 MG TAB: 10 | 2 days supply | Qty: 2 | Fill #0

## 2016-11-05 MED FILL — TRIAZOLAM 0.125 MG TABLET: 0.125 | 2 days supply | Qty: 4 | Fill #0

## 2016-11-28 ENCOUNTER — Other Ambulatory Visit: Payer: Self-pay

## 2016-11-28 MED ORDER — AMLODIPINE BESY-BENAZEPRIL HCL 2.5-10 MG PO CAPS: 1.0000 | ORAL_CAPSULE | Freq: Every day | ORAL | 1 refills | Status: DC

## 2016-11-28 MED FILL — AMLODIPINE-BENAZEPRIL 2.5-1: 2.5-10 | 90 days supply | Qty: 90 | Fill #0

## 2016-12-17 MED FILL — raNITIdine HCL 300 MG TABS: 300 | 30 days supply | Qty: 30 | Fill #0

## 2016-12-26 MED FILL — CYCLOBENZAPRINE 10 MG TAB: 10 | 2 days supply | Qty: 2 | Fill #0

## 2016-12-26 MED FILL — TRIAZOLAM 0.125 MG TABLET: 0.125 | 2 days supply | Qty: 4 | Fill #0

## 2017-01-14 MED FILL — ESCITALOPRAM 10 MG TABLET: 10 | 90 days supply | Qty: 90 | Fill #1

## 2017-01-20 ENCOUNTER — Other Ambulatory Visit: Payer: Self-pay

## 2017-01-20 ENCOUNTER — Telehealth: Payer: Self-pay

## 2017-01-20 MED ORDER — PANTOPRAZOLE SODIUM 40 MG PO TBEC: 40.0000 mg | DELAYED_RELEASE_TABLET | Freq: Every day | ORAL | 3 refills | Status: DC

## 2017-01-20 MED ORDER — PANTOPRAZOLE SODIUM 40 MG PO TBEC: 40.0000 mg | DELAYED_RELEASE_TABLET | Freq: Every day | ORAL | 1 refills | Status: DC

## 2017-01-20 NOTE — Telephone Encounter (Signed)
Pharmacy called for clarification on Protonix.  The order reads take 1 tab by mouth daily, then take 1 tab by mouth 2 times a day.  Please clarify.

## 2017-01-20 NOTE — Telephone Encounter (Signed)
80 mg per day exceeds the typically maximum dose of 40 mg per day, unless we are actively treating a GI bleed.  Rx should state once daily Note: If patient is needing this medicine twice per day, would recommend GI followup to evaluate for ulcers or other cause of severe disease. Last she saw GI looks like was 2016.

## 2017-01-20 NOTE — Telephone Encounter (Signed)
PATIENT REQUEST REFILL FOR pROTONIX 40 MG.  APPROVED AND SENT TO PHARMACY. Margaret Stanton,CMA

## 2017-01-20 NOTE — Telephone Encounter (Signed)
Spoke with Pam at the pharmacy and clarified 1 QD

## 2017-02-25 MED FILL — AMLODIPINE-BENAZEPRIL 2.5-1: 2.5-10 | 90 days supply | Qty: 90 | Fill #1

## 2017-02-25 MED FILL — PANTOPRAZOLE SOD DR 40 MG T: 40 | 90 days supply | Qty: 90 | Fill #0

## 2017-03-10 DIAGNOSIS — N6002 Solitary cyst of left breast: Secondary | ICD-10-CM | POA: Diagnosis not present

## 2017-03-10 DIAGNOSIS — N6001 Solitary cyst of right breast: Secondary | ICD-10-CM | POA: Diagnosis not present

## 2017-03-10 DIAGNOSIS — N6489 Other specified disorders of breast: Secondary | ICD-10-CM | POA: Diagnosis not present

## 2017-03-10 DIAGNOSIS — N6312 Unspecified lump in the right breast, upper inner quadrant: Secondary | ICD-10-CM | POA: Diagnosis not present

## 2017-03-10 DIAGNOSIS — R928 Other abnormal and inconclusive findings on diagnostic imaging of breast: Secondary | ICD-10-CM | POA: Diagnosis not present

## 2017-04-08 MED FILL — ESCITALOPRAM 10 MG TABLET: 10 | 90 days supply | Qty: 90 | Fill #2

## 2017-04-14 ENCOUNTER — Ambulatory Visit (INDEPENDENT_AMBULATORY_CARE_PROVIDER_SITE_OTHER): Payer: 59 | Admitting: Osteopathic Medicine

## 2017-04-14 ENCOUNTER — Encounter: Payer: Self-pay | Admitting: Osteopathic Medicine

## 2017-04-14 VITALS — BP 125/85 | HR 98 | Temp 98.5°F | Wt 203.0 lb

## 2017-04-14 DIAGNOSIS — I10 Essential (primary) hypertension: Secondary | ICD-10-CM

## 2017-04-14 DIAGNOSIS — Z0000 Encounter for general adult medical examination without abnormal findings: Secondary | ICD-10-CM

## 2017-04-14 DIAGNOSIS — B9789 Other viral agents as the cause of diseases classified elsewhere: Secondary | ICD-10-CM

## 2017-04-14 DIAGNOSIS — J069 Acute upper respiratory infection, unspecified: Secondary | ICD-10-CM | POA: Diagnosis not present

## 2017-04-14 MED ORDER — BENZONATATE 200 MG PO CAPS: 200.0000 mg | ORAL_CAPSULE | Freq: Three times a day (TID) | ORAL | 0 refills | Status: DC | PRN

## 2017-04-14 MED ORDER — IPRATROPIUM BROMIDE 0.06 % NA SOLN: 2.0000 | Freq: Four times a day (QID) | NASAL | 12 refills | Status: DC

## 2017-04-14 MED FILL — BENZONATATE 200 MG CAP: 200 | 10 days supply | Qty: 30 | Fill #0

## 2017-04-14 MED FILL — IPRATROPIUM 0.06% SPRAY: 0.06 | 11 days supply | Qty: 15 | Fill #0

## 2017-04-14 NOTE — Progress Notes (Signed)
HPI: Margaret Stanton is a 56 y.o. female who presents to [place] 04/14/17 for chief complaint of:  Chief Complaint  Patient presents with  . Sinus Problem   Acute Illness: Sinus congestion and drainage, scratchy throat, dry cough, diarrhea, muscle aches for 2 days. Has tried some over-the-counter Advil cold/flu formulation. Has been nervous to try other over-the-counter medications since some tend to cause palpitations. No fever    Past medical, social and family history reviewed.  Immune compromising conditions or other risk factors: none  Current medications and allergies reviewed.     Review of Systems:  Constitutional: No  fever/chills  HEENT: Yes  headache, No  sore throat, No  swollen glands  Cardiovascular: No chest pain  Respiratory:Yes  cough, No  shortness of breath  Gastrointestinal: No  nausea, No  vomiting,  Yes  diarrhea  Musculoskeletal:   Yes  myalgia/arthralgia  Skin/Integument:  No  rash   Detailed Exam:  BP 125/85   Pulse 98   Temp 98.5 F (36.9 C) (Oral)   Wt 203 lb (92.1 kg)   BMI 32.77 kg/m   Constitutional:   VSS, see above.   General Appearance: alert, well-developed, well-nourished, NAD  Eyes:   Normal lids and conjunctive, non-icteric sclera  Ears, Nose, Mouth, Throat:   Normal external inspection ears/nares  Normal mouth/lips/gums, MMM  normal TM  posterior pharynx without erythema, without exudate  nasal mucosa normal  Skin:  Normal inspection, no rash or concerning lesions noted on limited exam  Neck:   No masses, trachea midline. normal lymph nodes  Respiratory:   Normal respiratory effort.   No  wheeze/rhonchi/rales  Cardiovascular:   S1/S2 normal, no murmur/rub/gallop auscultated. RRR.     ASSESSMENT/PLAN: Advise most likely viral almost, supportive care and symptom treatment while illness resolves on its own, call us if worse/change, treatment as below.  Viral  URI with cough - Plan: ipratropium (ATROVENT) 0.06 % nasal spray, benzonatate (TESSALON) 200 MG capsule  Annual physical exam - Plan: CBC, COMPLETE METABOLIC PANEL WITH GFR, Lipid panel, TSH  Essential hypertension, benign - Plan: CBC, COMPLETE METABOLIC PANEL WITH GFR, Lipid panel, TSH     Patient Instructions  Over-the-Counter Medications & Home Remedies for Upper Respiratory Illness  Note: the following list assumes no pregnancy, normal liver & kidney function and no other drug interactions. Dr. Sheppard Coil has highlighted medications which are safe for you to use, but these may not be appropriate for everyone. Always ask a pharmacist or qualified medical provider if you have any questions!   Aches/Pains, Fever, Headache Acetaminophen (Tylenol) 500 mg tablets - take max 2 tablets (1000 mg) every 6 hours (4 times per day)  Ibuprofen (Motrin) 200 mg tablets - take max 4 tablets (800 mg) every 6 hours*  Sinus Congestion Prescription Atrovent as directed Nasal Saline if desired Phenylephrine (Sudafed) 10 mg tablets every 4 hours (or the 12-hour formulation)* Diphenhydramine (Benadryl) 25 mg tablets - take max 2 tablets every 4 hours  Cough & Sore Throat  Prescription cough pills or syrups as directed Dextromethorphan (Robitussin, others) - cough suppressant Guaifenesin (Robitussin, Mucinex, others) - expectorant (helps cough up mucus) (Dextromethorphan and Guaifenesin also come in a combination tablet) Lozenges w/ Benzocaine + Menthol (Cepacol) Honey - as much as you want! Teas which "coat the throat" - look for ingredients Elm Bark, Licorice Root, Marshmallow Root  Other  Zinc Lozenges within 24 hours of symptoms onset - mixed evidence this shortens the  duration of the common cold Don't waste your money on Vitamin C or Echinacea  *Caution in patients with high blood pressure     Labs ordered for annual physical, future visit. Annual preventive care was not performed or billed  today    Visit summary was printed for the patient with medications and pertinent instructions for patient to review. ER/RTC precautions reviewed. All questions answered. Return for annual physical when due - labs ahead of time as directed .

## 2017-04-14 NOTE — Patient Instructions (Signed)
Over-the-Counter Medications & Home Remedies for Upper Respiratory Illness  Note: the following list assumes no pregnancy, normal liver & kidney function and no other drug interactions. Dr. Howie Rufus has highlighted medications which are safe for you to use, but these may not be appropriate for everyone. Always ask a pharmacist or qualified medical provider if you have any questions!   Aches/Pains, Fever, Headache Acetaminophen (Tylenol) 500 mg tablets - take max 2 tablets (1000 mg) every 6 hours (4 times per day)  Ibuprofen (Motrin) 200 mg tablets - take max 4 tablets (800 mg) every 6 hours*  Sinus Congestion Prescription Atrovent as directed Nasal Saline if desired Phenylephrine (Sudafed) 10 mg tablets every 4 hours (or the 12-hour formulation)* Diphenhydramine (Benadryl) 25 mg tablets - take max 2 tablets every 4 hours  Cough & Sore Throat Prescription cough pills or syrups as directed Dextromethorphan (Robitussin, others) - cough suppressant Guaifenesin (Robitussin, Mucinex, others) - expectorant (helps cough up mucus) (Dextromethorphan and Guaifenesin also come in a combination tablet) Lozenges w/ Benzocaine + Menthol (Cepacol) Honey - as much as you want! Teas which "coat the throat" - look for ingredients Elm Bark, Licorice Root, Marshmallow Root  Other Zinc Lozenges within 24 hours of symptoms onset - mixed evidence this shortens the duration of the common cold Don't waste your money on Vitamin C or Echinacea  *Caution in patients with high blood pressure    

## 2017-05-12 ENCOUNTER — Ambulatory Visit: Payer: Self-pay | Admitting: Osteopathic Medicine

## 2017-05-13 DIAGNOSIS — I10 Essential (primary) hypertension: Secondary | ICD-10-CM | POA: Diagnosis not present

## 2017-05-13 DIAGNOSIS — Z0000 Encounter for general adult medical examination without abnormal findings: Secondary | ICD-10-CM | POA: Diagnosis not present

## 2017-05-13 LAB — TSH: TSH: 2.37 m[IU]/L

## 2017-05-13 LAB — COMPLETE METABOLIC PANEL WITH GFR
ALBUMIN: 4.4 g/dL (ref 3.6–5.1)
ALT: 36 U/L — AB (ref 6–29)
AST: 33 U/L (ref 10–35)
Alkaline Phosphatase: 94 U/L (ref 33–130)
BILIRUBIN TOTAL: 0.5 mg/dL (ref 0.2–1.2)
BUN: 11 mg/dL (ref 7–25)
CO2: 30 mmol/L (ref 20–31)
CREATININE: 0.78 mg/dL (ref 0.50–1.05)
Calcium: 8.8 mg/dL (ref 8.6–10.4)
Chloride: 103 mmol/L (ref 98–110)
GFR, Est Non African American: 86 mL/min (ref >=60)
Glucose, Bld: 89 mg/dL (ref 65–99)
Potassium: 3.7 mmol/L (ref 3.5–5.3)
Sodium: 141 mmol/L (ref 135–146)
TOTAL PROTEIN: 7 g/dL (ref 6.1–8.1)

## 2017-05-13 LAB — LIPID PANEL
Cholesterol: 217 mg/dL — ABNORMAL HIGH (ref <200)
HDL: 71 mg/dL (ref >50)
LDL CALC: 128 mg/dL — AB (ref <100)
TRIGLYCERIDES: 92 mg/dL (ref <150)
Total CHOL/HDL Ratio: 3.1 Ratio (ref <5.0)
VLDL: 18 mg/dL (ref <30)

## 2017-05-13 LAB — CBC
HCT: 30.9 % — ABNORMAL LOW (ref 35.0–45.0)
Hemoglobin: 10.5 g/dL — ABNORMAL LOW (ref 11.7–15.5)
MCH: 30.3 pg (ref 27.0–33.0)
MCHC: 34 g/dL (ref 32.0–36.0)
MCV: 89.3 fL (ref 80.0–100.0)
MPV: 9.8 fL (ref 7.5–12.5)
PLATELETS: 274 10*3/uL (ref 140–400)
RBC: 3.46 MIL/uL — ABNORMAL LOW (ref 3.80–5.10)
RDW: 13.4 % (ref 11.0–15.0)
WBC: 6.5 10*3/uL (ref 3.8–10.8)

## 2017-05-16 ENCOUNTER — Encounter: Payer: Self-pay | Admitting: Osteopathic Medicine

## 2017-05-16 ENCOUNTER — Ambulatory Visit (INDEPENDENT_AMBULATORY_CARE_PROVIDER_SITE_OTHER): Payer: 59 | Admitting: Osteopathic Medicine

## 2017-05-16 VITALS — BP 120/81 | HR 82 | Wt 203.0 lb

## 2017-05-16 DIAGNOSIS — I10 Essential (primary) hypertension: Secondary | ICD-10-CM

## 2017-05-16 DIAGNOSIS — Z8673 Personal history of transient ischemic attack (TIA), and cerebral infarction without residual deficits: Secondary | ICD-10-CM

## 2017-05-16 DIAGNOSIS — R740 Nonspecific elevation of levels of transaminase and lactic acid dehydrogenase [LDH]: Secondary | ICD-10-CM

## 2017-05-16 DIAGNOSIS — D649 Anemia, unspecified: Secondary | ICD-10-CM

## 2017-05-16 DIAGNOSIS — F3342 Major depressive disorder, recurrent, in full remission: Secondary | ICD-10-CM

## 2017-05-16 DIAGNOSIS — R7401 Elevation of levels of liver transaminase levels: Secondary | ICD-10-CM

## 2017-05-16 DIAGNOSIS — Z0000 Encounter for general adult medical examination without abnormal findings: Secondary | ICD-10-CM

## 2017-05-16 MED ORDER — AMLODIPINE BESY-BENAZEPRIL HCL 2.5-10 MG PO CAPS: 1.0000 | ORAL_CAPSULE | Freq: Every day | ORAL | 3 refills | Status: DC

## 2017-05-16 MED ORDER — ASPIRIN 325 MG PO TBEC: 325.0000 mg | DELAYED_RELEASE_TABLET | Freq: Every day | ORAL | 3 refills | Status: DC

## 2017-05-16 MED ORDER — PANTOPRAZOLE SODIUM 40 MG PO TBEC: 40.0000 mg | DELAYED_RELEASE_TABLET | Freq: Every day | ORAL | 3 refills | Status: DC

## 2017-05-16 MED ORDER — ESCITALOPRAM OXALATE 10 MG PO TABS: 10.0000 mg | ORAL_TABLET | Freq: Every day | ORAL | 3 refills | Status: DC

## 2017-05-16 MED ORDER — ATORVASTATIN CALCIUM 40 MG PO TABS: 40.0000 mg | ORAL_TABLET | Freq: Every day | ORAL | 3 refills | Status: DC

## 2017-05-16 MED FILL — PANTOPRAZOLE SOD DR 40 MG T: 40 | 90 days supply | Qty: 90 | Fill #0

## 2017-05-16 MED FILL — AMLODIPINE-BENAZEPRIL 2.5-1: 2.5-10 | 90 days supply | Qty: 90 | Fill #0

## 2017-05-16 MED FILL — ATORVASTATIN 40 MG TABLET: 40 | 90 days supply | Qty: 90 | Fill #0

## 2017-05-16 MED FILL — ASPIRIN EC 325 MG TABLET: 325 | 90 days supply | Qty: 90 | Fill #0

## 2017-05-16 NOTE — Progress Notes (Signed)
HPI: Margaret Stanton is a 56 y.o. female  who presents to [place] today, 05/16/17,  for chief complaint of:  Chief Complaint  Patient presents with  . f/u medications    refills  . Annual Exam     Patient here for annual physical / wellness exam.  See preventive care reviewed as below.  Recent labs reviewed in detail with the patient.   Additional concerns today include:  None Hx CVA (TIA symptoms w/ concerning findings on CTA in 2012) so on ASA, statin, BP control HTN well controlled Chronic stable anemia    Past medical, surgical, social and family history reviewed: Patient Active Problem List   Diagnosis Date Noted  . Anemia 05/14/2016  . Contusion of rib on left side 08/17/2015  . 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 Common bile duct (CBD) obstruction 09/13/2014  . Migraines 05/02/2014  . Essential hypertension, benign 04/21/2014  . Hyperlipidemia LDL goal <100 04/21/2014  . History of CVA (cerebrovascular accident) without residual deficits 04/21/2014  . Depression 04/21/2014  . GERD (gastroesophageal reflux disease) 04/21/2014  . Stricture and stenosis of esophagus 04/12/2014   Past Surgical History:  Procedure Laterality Date  . ABLATION  01-2012   uterine  . CHOLECYSTECTOMY    . CHOLECYSTECTOMY, LAPAROSCOPIC  07-2006   Social History  Substance Use Topics  . Smoking status: Never Smoker  . Smokeless tobacco: Never Used  . Alcohol use Yes     Comment: 1 glass of wine about every 2 months   Family History  Problem Relation Age of Onset  . Heart attack Father   . Diabetes Mother   . Hypertension Unknown        parents  . Colon cancer Neg Hx   . Pancreatic cancer Neg Hx   . Rectal cancer Neg Hx   . Stomach cancer Neg Hx      Current medication list and allergy/intolerance information reviewed:   Current Outpatient Prescriptions  Medication Sig Dispense Refill  . amlodipine-benazepril (LOTREL) 2.5-10 MG  capsule Take 1 capsule by mouth daily. 90 capsule 1  . aspirin 325 MG EC tablet Take 1 tablet (325 mg total) by mouth daily. 90 tablet 3  . atorvastatin (LIPITOR) 40 MG tablet Take 1.5 tablets (60 mg total) by mouth daily. 135 tablet 0  . benzonatate (TESSALON) 200 MG capsule Take 1 capsule (200 mg total) by mouth 3 (three) times daily as needed for cough. 30 capsule 0  . escitalopram (LEXAPRO) 10 MG tablet Take 1 tablet (10 mg total) by mouth daily. 90 tablet 3  . ipratropium (ATROVENT) 0.06 % nasal spray Place 2 sprays into both nostrils 4 (four) times daily. 15 mL 12  . pantoprazole (PROTONIX) 40 MG tablet Take 1 tablet (40 mg total) by mouth daily. 90 tablet 3  . ranitidine (ZANTAC) 300 MG tablet Take 1 tablet (300 mg total) by mouth at bedtime. 90 tablet 1   No current facility-administered medications for this visit.    No Known Allergies    Review of Systems:  Constitutional:  No  fever, no chills, No recent illness, No unintentional weight changes. No significant fatigue.   HEENT: No  headache, no vision change, no hearing change  Cardiac: No  chest pain, No  pressure, No palpitations,  Respiratory:  No  shortness of breath. No  Cough  Gastrointestinal: No  abdominal pain, No  nausea, No  vomiting,  No  blood in stool, No  diarrhea, No  constipation   Musculoskeletal: No new myalgia/arthralgia  Skin: No  Rash  Hem/Onc: No  easy bruising/bleeding,   Neurologic: No  weakness, No  dizziness,   Psychiatric: No  concerns with depression, No  concerns with anxiety, No sleep problems, No mood problems  Exam:  BP 120/81   Pulse 82   Wt 203 lb (92.1 kg)   BMI 32.77 kg/m   Constitutional: VS see above. General Appearance: alert, well-developed, well-nourished, NAD  Eyes: Normal lids and conjunctive, non-icteric sclera  Ears, Nose, Mouth, Throat: MMM, Normal external inspection ears/nares/mouth/lips/gums. TM normal bilaterally. Pharynx/tonsils no erythema, no exudate.  Nasal mucosa normal.   Neck: No masses, trachea midline. No thyroid enlargement. No tenderness/mass appreciated. No lymphadenopathy  Respiratory: Normal respiratory effort. no wheeze, no rhonchi, no rales  Cardiovascular: S1/S2 normal, no murmur, no rub/gallop auscultated. RRR. No lower extremity edema.   Gastrointestinal: Nontender, no masses. No hepatomegaly, no splenomegaly. No hernia appreciated. Bowel sounds normal. Rectal exam deferred.   Musculoskeletal: Gait normal. No clubbing/cyanosis of digits.   Neurological: Normal balance/coordination. No tremor.    Skin: warm, dry, intact. No rash/ulcer. No concerning nevi or subq nodules on limited exam.    Psychiatric: Normal judgment/insight. Normal mood and affect. Oriented x3.      ASSESSMENT/PLAN:  Annual physical exam  History of CVA (cerebrovascular accident) without residual deficits - Plan: atorvastatin (LIPITOR) 40 MG tablet, Lipid panel  Essential hypertension, benign - Plan: aspirin 325 MG EC tablet, Lipid panel  Recurrent major depressive disorder, in full remission ([place]) - 90 day prescription for Lexapro written. - Plan: escitalopram (LEXAPRO) 10 MG tablet  Anemia, unspecified type - Plan: CBC, Ferritin  Elevated ALT measurement - Plan: Hepatic function panel   FEMALE PREVENTIVE CARE Updated 05/16/17   ANNUAL SCREENING/COUNSELING  Diet/Exercise - HEALTHY HABITS DISCUSSED TO DECREASE CV RISK History  Smoking Status  . Never Smoker  Smokeless Tobacco  . Never Used   History  Alcohol Use  . Yes    Comment: 1 glass of wine about every 2 months   Depression screen [facility] 2/9 05/16/2017  Decreased Interest 0  Down, Depressed, Hopeless 0  PHQ - 2 Score 0  Altered sleeping 0  Tired, decreased energy 1  Change in appetite 0  Feeling bad or failure about yourself  0  Trouble concentrating 0  Moving slowly or fidgety/restless 0  Suicidal thoughts 0  PHQ-9 Score 1    Domestic violence concerns -  no  HTN SCREENING - SEE VITALS  SEXUAL HEALTH - see OBGYN  Sexually active in the past year - Yes with female.  Need/want STI testing today? - no  Concerns about libido or pain with sex? - no  Plans for pregnancy? - postmenopausal  INFECTIOUS DISEASE SCREENING  HIV - needs  GC/CT - does not need  HepC - DOB [birth year] - does not need  TB - does not need  DISEASE SCREENING  Lipid - does not need  DM2 - does not need  Osteoporosis - women age 37+ - does not need  CANCER SCREENING  Cervical - does not need  Breast - does not need  Lung - does not need  Colon - does not need  ADULT VACCINATION  Influenza - annual vaccine recommended  Td - booster every 10 years   Zoster - option at 50, yes at 60+   PCV13 - was not indicated  PPSV23 - was not indicated Immunization History  Administered Date(s) Administered  .  Influenza-Unspecified 08/06/2016  . Td 01/23/2011   OTHER  Fall - exercise and Vit D age 18+ - does not need  Consider ASA - age 11-59 - needs     Visit summary with medication list and pertinent instructions was printed for patient to review. All questions at time of visit were answered - patient instructed to contact office with any additional concerns. ER/RTC precautions were reviewed with the patient. Follow-up plan: Return in about 6 months (around 11/16/2017) for BP and recheck labs for cholesterol and anemia .

## 2017-06-25 DIAGNOSIS — K838 Other specified diseases of biliary tract: Secondary | ICD-10-CM | POA: Diagnosis not present

## 2017-06-25 DIAGNOSIS — R1011 Right upper quadrant pain: Secondary | ICD-10-CM | POA: Diagnosis not present

## 2017-07-15 MED FILL — ESCITALOPRAM 10 MG TABLET: 10 | 90 days supply | Qty: 90 | Fill #3

## 2017-08-26 MED FILL — PANTOPRAZOLE SOD DR 40 MG T: 40 | 90 days supply | Qty: 90 | Fill #1

## 2017-08-26 MED FILL — AMLODIPINE-BENAZEPRIL 2.5-1: 2.5-10 | 90 days supply | Qty: 90 | Fill #1

## 2017-08-27 IMAGING — CR DG RIBS W/ CHEST 3+V*L*
3 series · 3 of 3 positions shown · non-contrast
Comparison: None.

CLINICAL DATA: Left lower rib pain status post fall.

EXAM:
LEFT RIBS AND CHEST - 3+ VIEW

[chest pa]
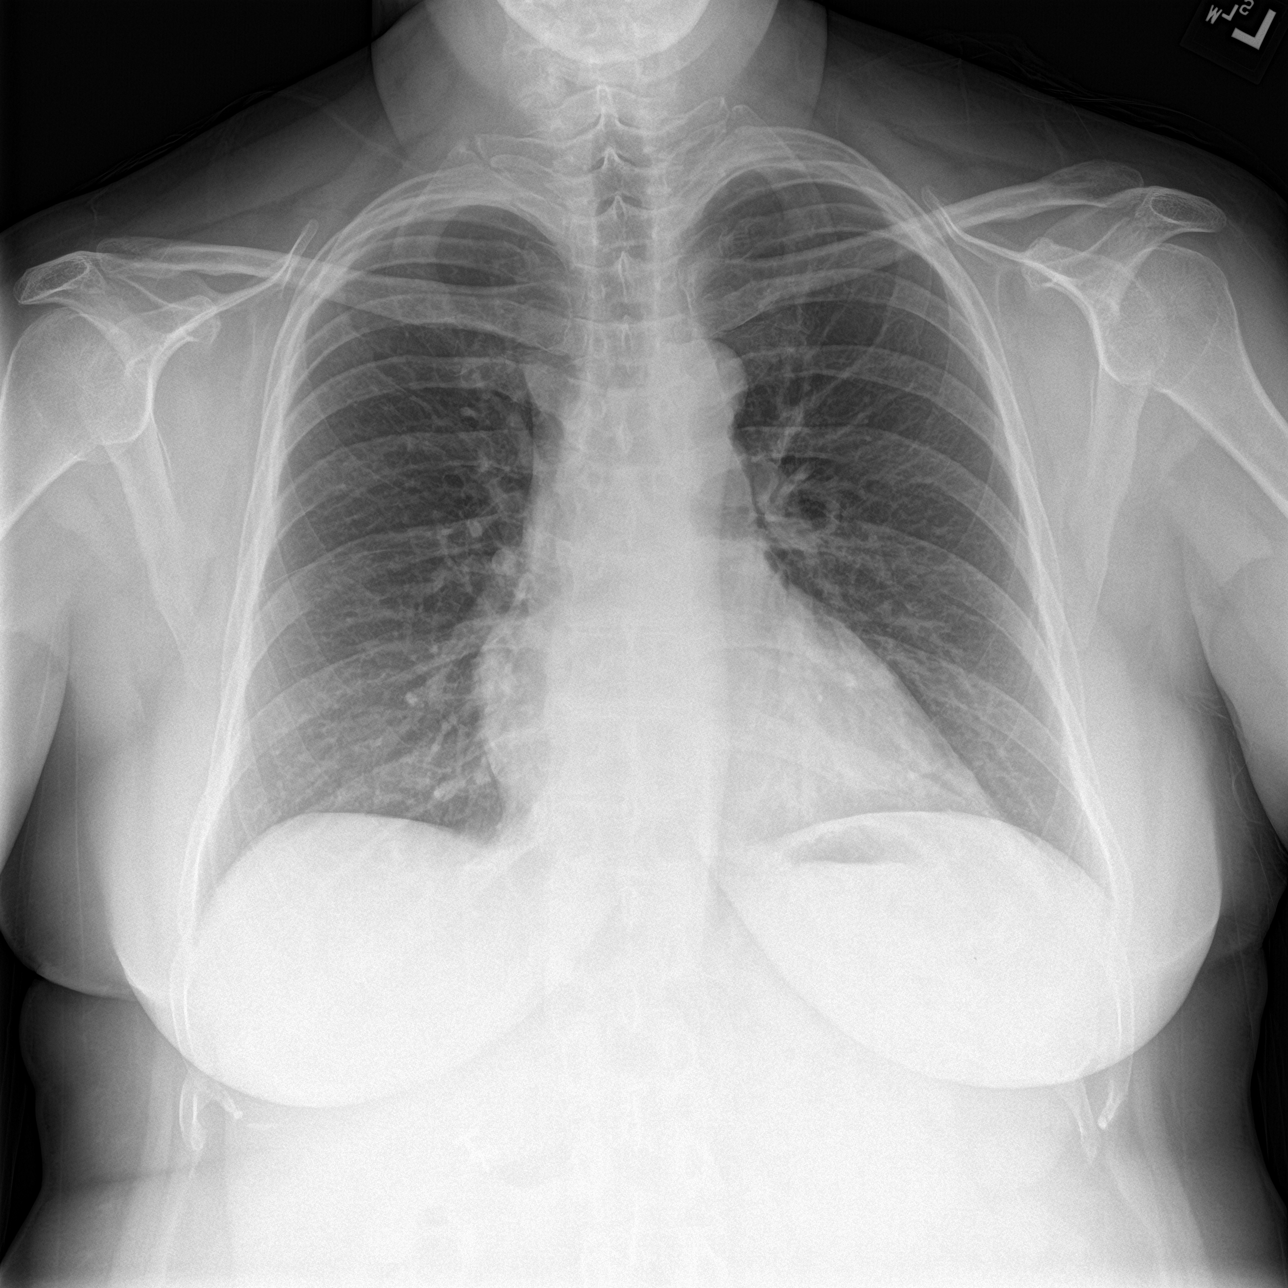

[rib ap]
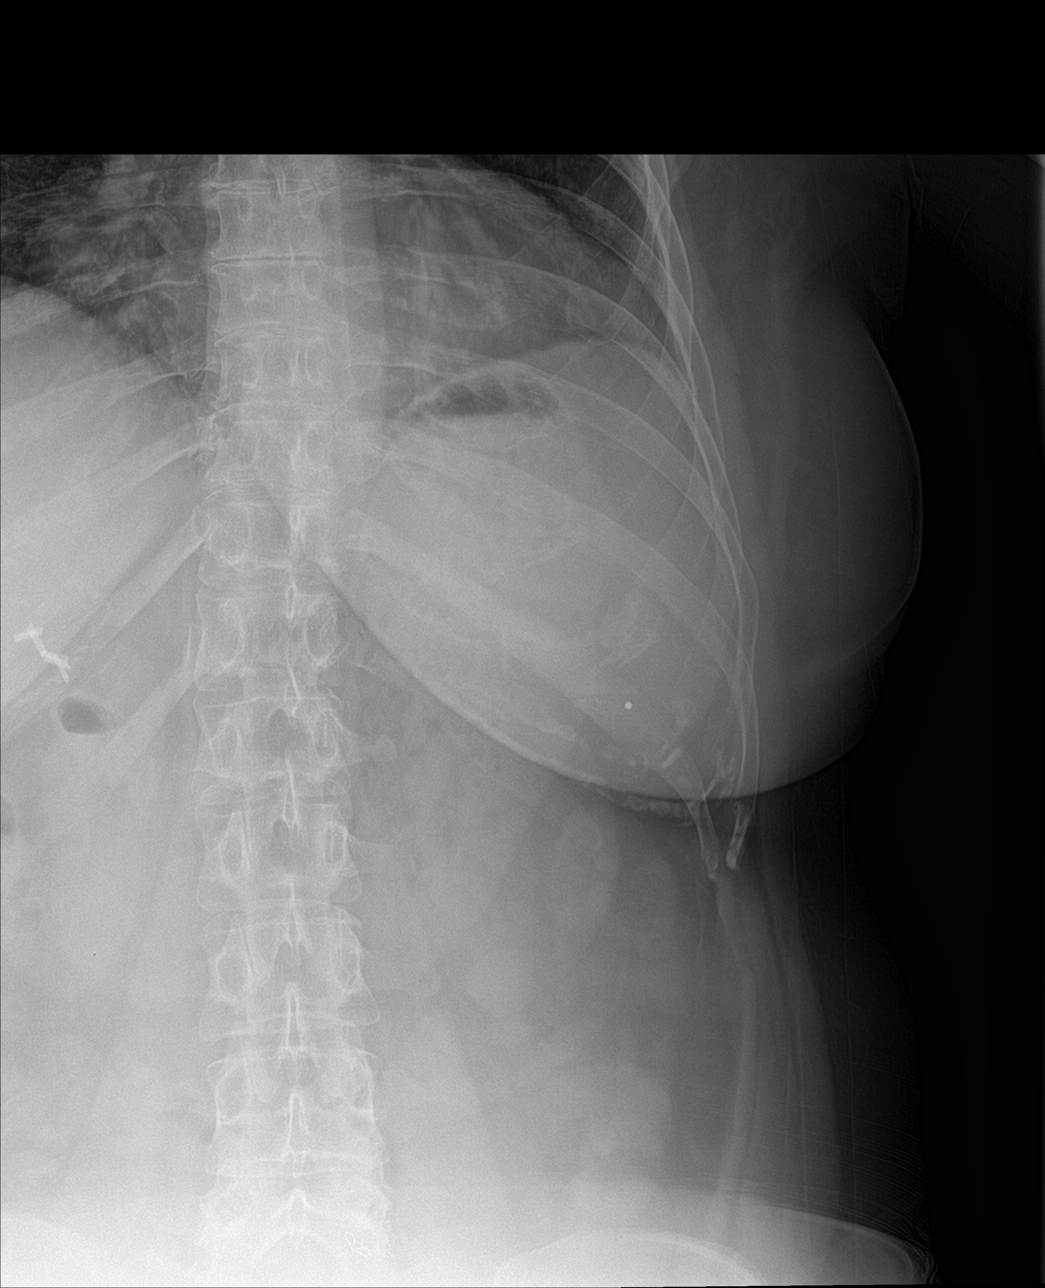

[rib ap obl]
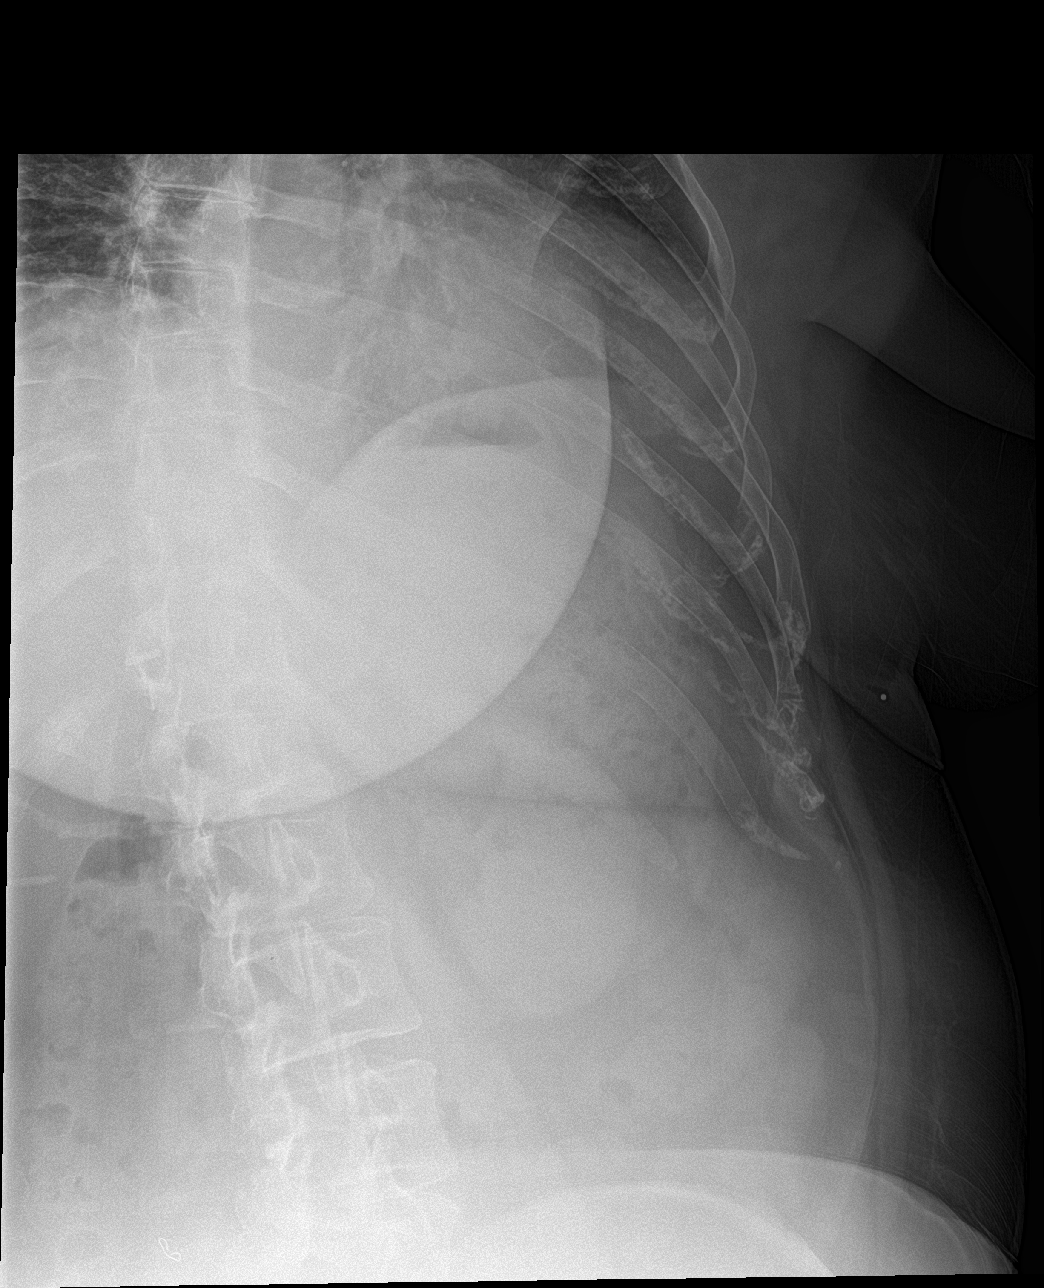

[3 of 3 positions shown; findings below may reference images not displayed]

FINDINGS: No fracture or other bone lesions are seen involving the ribs. There
is no evidence of pneumothorax or pleural effusion. Both lungs are
clear. Heart size and mediastinal contours are within normal limits.
IMPRESSION: No acute osseous injury of the left ribs.

## 2017-10-22 MED FILL — ESCITALOPRAM 10 MG TABLET: 10 | 90 days supply | Qty: 90 | Fill #0

## 2017-10-24 ENCOUNTER — Telehealth: Payer: Self-pay

## 2017-10-24 MED ORDER — PANTOPRAZOLE SODIUM 40 MG PO TBEC: 40.0000 mg | DELAYED_RELEASE_TABLET | Freq: Two times a day (BID) | ORAL | 3 refills | Status: DC

## 2017-10-24 NOTE — Telephone Encounter (Signed)
FYI - Pharmacy requesting new rx with new sig for pantoprazole. States that patient is taking medication twice daily.

## 2017-10-24 NOTE — Telephone Encounter (Signed)
Rx sent  Clinical note: 40 mg bid is a high dose of his medicine but if this is controlling symptoms then I'm ok to refill and will discuss risks vs benefits of long-term PPI use at next visit

## 2017-10-27 NOTE — Telephone Encounter (Signed)
Left VM updating Pt that Rx was sent.

## 2017-11-03 MED FILL — PANTOPRAZOLE SOD DR 40 MG T: 40 | 90 days supply | Qty: 180 | Fill #0

## 2017-11-28 MED FILL — AMLODIPINE-BENAZEPRIL 2.5-1: 2.5-10 | 90 days supply | Qty: 90 | Fill #2

## 2018-01-27 MED FILL — ESCITALOPRAM 10 MG TABLET: 10 | 90 days supply | Qty: 90 | Fill #1

## 2018-01-28 MED FILL — PANTOPRAZOLE SOD DR 40 MG T: 40 | 90 days supply | Qty: 180 | Fill #1

## 2018-01-28 MED FILL — ATORVASTATIN 40 MG TABLET: 40 | 90 days supply | Qty: 90 | Fill #1

## 2018-02-25 MED FILL — AMLODIPINE-BENAZEPRIL 2.5-1: 2.5-10 | 90 days supply | Qty: 90 | Fill #3

## 2018-04-28 MED FILL — ESCITALOPRAM 10 MG TABLET: 10 | 90 days supply | Qty: 90 | Fill #2

## 2018-05-13 MED FILL — PANTOPRAZOLE SOD DR 40 MG T: 40 | 90 days supply | Qty: 180 | Fill #2

## 2018-05-25 ENCOUNTER — Telehealth: Payer: Self-pay | Admitting: Osteopathic Medicine

## 2018-05-25 ENCOUNTER — Other Ambulatory Visit: Payer: Self-pay

## 2018-05-25 DIAGNOSIS — F3342 Major depressive disorder, recurrent, in full remission: Secondary | ICD-10-CM

## 2018-05-25 DIAGNOSIS — Z8673 Personal history of transient ischemic attack (TIA), and cerebral infarction without residual deficits: Secondary | ICD-10-CM

## 2018-05-25 DIAGNOSIS — I10 Essential (primary) hypertension: Secondary | ICD-10-CM

## 2018-05-25 MED ORDER — ATORVASTATIN CALCIUM 40 MG PO TABS: 40.0000 mg | ORAL_TABLET | Freq: Every day | ORAL | 0 refills | Status: DC

## 2018-05-25 MED ORDER — ESCITALOPRAM OXALATE 10 MG PO TABS: 10.0000 mg | ORAL_TABLET | Freq: Every day | ORAL | 0 refills | Status: DC

## 2018-05-25 MED ORDER — AMLODIPINE BESY-BENAZEPRIL HCL 2.5-10 MG PO CAPS: 1.0000 | ORAL_CAPSULE | Freq: Every day | ORAL | 0 refills | Status: DC

## 2018-05-25 MED FILL — ATORVASTATIN 40 MG TABLET: 40 | 30 days supply | Qty: 30 | Fill #0

## 2018-05-25 MED FILL — AMLODIPINE-BENAZEPRIL 2.5-1: 2.5-10 | 30 days supply | Qty: 30 | Fill #0

## 2018-05-25 NOTE — Telephone Encounter (Signed)
amlodipine-benazepril (LOTREL) 2.5-10 MG capsule [[id number]]   PT needs refill before 06/03/18 appt.   Please advise

## 2018-05-25 NOTE — Telephone Encounter (Signed)
Amlodipine-benazepril, atorvastatin & escitalopram has been refilled for 30 days. Pt has been updated & informed that she must keep appt with provider on 06/03/18 for follow up.

## 2018-05-27 ENCOUNTER — Encounter: Payer: 59 | Admitting: Osteopathic Medicine

## 2018-05-29 ENCOUNTER — Other Ambulatory Visit: Payer: Self-pay | Admitting: Osteopathic Medicine

## 2018-05-29 DIAGNOSIS — D649 Anemia, unspecified: Secondary | ICD-10-CM | POA: Diagnosis not present

## 2018-05-29 DIAGNOSIS — I10 Essential (primary) hypertension: Secondary | ICD-10-CM | POA: Diagnosis not present

## 2018-05-29 DIAGNOSIS — Z8673 Personal history of transient ischemic attack (TIA), and cerebral infarction without residual deficits: Secondary | ICD-10-CM | POA: Diagnosis not present

## 2018-05-29 DIAGNOSIS — R740 Nonspecific elevation of levels of transaminase and lactic acid dehydrogenase [LDH]: Secondary | ICD-10-CM | POA: Diagnosis not present

## 2018-05-30 LAB — HEPATIC FUNCTION PANEL
AG RATIO: 1.9 (calc) (ref 1.0–2.5)
ALT: 34 U/L — ABNORMAL HIGH (ref 6–29)
AST: 27 U/L (ref 10–35)
Albumin: 4.5 g/dL (ref 3.6–5.1)
Alkaline phosphatase (APISO): 123 U/L (ref 33–130)
Bilirubin, Direct: 0.1 mg/dL (ref 0.0–0.2)
GLOBULIN: 2.4 g/dL (ref 1.9–3.7)
Indirect Bilirubin: 0.3 mg/dL (calc) (ref 0.2–1.2)
Total Bilirubin: 0.4 mg/dL (ref 0.2–1.2)
Total Protein: 6.9 g/dL (ref 6.1–8.1)

## 2018-05-30 LAB — CBC WITH DIFFERENTIAL/PLATELET
BASOS PCT: 0.7 %
Basophils Absolute: 43 cells/uL (ref 0–200)
EOS ABS: 92 {cells}/uL (ref 15–500)
Eosinophils Relative: 1.5 %
HCT: 30.7 % — ABNORMAL LOW (ref 35.0–45.0)
HEMOGLOBIN: 10.5 g/dL — AB (ref 11.7–15.5)
Lymphs Abs: 1598 cells/uL (ref 850–3900)
MCH: 29.9 pg (ref 27.0–33.0)
MCHC: 34.2 g/dL (ref 32.0–36.0)
MCV: 87.5 fL (ref 80.0–100.0)
MONOS PCT: 5.6 %
MPV: 10.2 fL (ref 7.5–12.5)
NEUTROS ABS: 4026 {cells}/uL (ref 1500–7800)
Neutrophils Relative %: 66 %
PLATELETS: 270 10*3/uL (ref 140–400)
RBC: 3.51 10*6/uL — AB (ref 3.80–5.10)
RDW: 13.6 % (ref 11.0–15.0)
TOTAL LYMPHOCYTE: 26.2 %
WBC: 6.1 10*3/uL (ref 3.8–10.8)
WBCMIX: 342 {cells}/uL (ref 200–950)

## 2018-05-30 LAB — LIPID PANEL W/REFLEX DIRECT LDL
CHOL/HDL RATIO: 3.4 (calc) (ref <5.0)
CHOLESTEROL: 241 mg/dL — AB (ref <200)
HDL: 71 mg/dL (ref >50)
LDL CHOLESTEROL (CALC): 150 mg/dL — AB
Non-HDL Cholesterol (Calc): 170 mg/dL (calc) — ABNORMAL HIGH (ref <130)
Triglycerides: 94 mg/dL (ref <150)

## 2018-05-30 LAB — FERRITIN: Ferritin: 50 ng/mL (ref 16–232)

## 2018-06-03 ENCOUNTER — Encounter: Payer: Self-pay | Admitting: Osteopathic Medicine

## 2018-06-03 ENCOUNTER — Ambulatory Visit (INDEPENDENT_AMBULATORY_CARE_PROVIDER_SITE_OTHER): Payer: 59 | Admitting: Osteopathic Medicine

## 2018-06-03 ENCOUNTER — Telehealth: Payer: Self-pay

## 2018-06-03 VITALS — BP 125/77 | HR 91 | Temp 98.3°F | Wt 218.2 lb

## 2018-06-03 DIAGNOSIS — R740 Nonspecific elevation of levels of transaminase and lactic acid dehydrogenase [LDH]: Secondary | ICD-10-CM | POA: Diagnosis not present

## 2018-06-03 DIAGNOSIS — K2100 Gastro-esophageal reflux disease with esophagitis, without bleeding: Secondary | ICD-10-CM

## 2018-06-03 DIAGNOSIS — D649 Anemia, unspecified: Secondary | ICD-10-CM

## 2018-06-03 DIAGNOSIS — Z0000 Encounter for general adult medical examination without abnormal findings: Secondary | ICD-10-CM

## 2018-06-03 DIAGNOSIS — E785 Hyperlipidemia, unspecified: Secondary | ICD-10-CM | POA: Diagnosis not present

## 2018-06-03 DIAGNOSIS — F3342 Major depressive disorder, recurrent, in full remission: Secondary | ICD-10-CM | POA: Diagnosis not present

## 2018-06-03 DIAGNOSIS — I10 Essential (primary) hypertension: Secondary | ICD-10-CM

## 2018-06-03 DIAGNOSIS — Z8673 Personal history of transient ischemic attack (TIA), and cerebral infarction without residual deficits: Secondary | ICD-10-CM | POA: Diagnosis not present

## 2018-06-03 DIAGNOSIS — R7401 Elevation of levels of liver transaminase levels: Secondary | ICD-10-CM

## 2018-06-03 MED ORDER — AMLODIPINE BESY-BENAZEPRIL HCL 2.5-10 MG PO CAPS: 1.0000 | ORAL_CAPSULE | Freq: Every day | ORAL | 3 refills | Status: DC

## 2018-06-03 MED ORDER — ESCITALOPRAM OXALATE 10 MG PO TABS: 10.0000 mg | ORAL_TABLET | Freq: Every day | ORAL | 3 refills | Status: DC

## 2018-06-03 MED ORDER — PANTOPRAZOLE SODIUM 40 MG PO TBEC: 40.0000 mg | DELAYED_RELEASE_TABLET | Freq: Two times a day (BID) | ORAL | 3 refills | Status: DC

## 2018-06-03 MED ORDER — ASPIRIN 325 MG PO TBEC: 325.0000 mg | DELAYED_RELEASE_TABLET | Freq: Every day | ORAL | 3 refills | Status: DC

## 2018-06-03 MED ORDER — ATORVASTATIN CALCIUM 40 MG PO TABS: 40.0000 mg | ORAL_TABLET | Freq: Every day | ORAL | 3 refills | Status: DC

## 2018-06-03 NOTE — Telephone Encounter (Signed)
Emeterio Reeve, DO  Huel Cote, LPN        shingrix list!      Added

## 2018-06-03 NOTE — Progress Notes (Signed)
HPI: Margaret Stanton is a 57 y.o. female  who presents to [facility] today, 06/03/18,  for chief complaint of:  Annual Physical   Patient here for annual physical / wellness exam.  See preventive care reviewed as below.  Recent labs reviewed in detail with the patient.   Additional concerns today include:  None Hx CVA (TIA symptoms w/ concerning findings on CTA in 2012) so on ASA, statin, BP control HTN well controlled Chronic stable anemia  Following with OBGYN for Pap and Mammo    Past medical, surgical, social and family history reviewed: Patient Active Problem List   Diagnosis Date Noted  . Anemia 05/14/2016  . Contusion of rib on left side 08/17/2015  . 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 Common bile duct (CBD) obstruction 09/13/2014  . Migraines 05/02/2014  . Essential hypertension, benign 04/21/2014  . Hyperlipidemia LDL goal <100 04/21/2014  . History of CVA (cerebrovascular accident) without residual deficits 04/21/2014  . Depression 04/21/2014  . GERD (gastroesophageal reflux disease) 04/21/2014  . Stricture and stenosis of esophagus 04/12/2014   Past Surgical History:  Procedure Laterality Date  . ABLATION  01-2012   uterine  . CHOLECYSTECTOMY    . CHOLECYSTECTOMY, LAPAROSCOPIC  07-2006   Social History   Tobacco Use  . Smoking status: Never Smoker  . Smokeless tobacco: Never Used  Substance Use Topics  . Alcohol use: Yes    Comment: 1 glass of wine about every 2 months   Family History  Problem Relation Age of Onset  . Heart attack Father   . Diabetes Mother   . Hypertension Unknown        parents  . Colon cancer Neg Hx   . Pancreatic cancer Neg Hx   . Rectal cancer Neg Hx   . Stomach cancer Neg Hx      Current medication list and allergy/intolerance information reviewed:   Current Outpatient Medications  Medication Sig Dispense Refill  . amlodipine-benazepril (LOTREL) 2.5-10 MG capsule Take 1  capsule by mouth daily. Pt must keep 06/03/18 appt for refills 30 capsule 0  . aspirin 325 MG EC tablet Take 1 tablet (325 mg total) by mouth daily. 90 tablet 3  . atorvastatin (LIPITOR) 40 MG tablet Take 1 tablet (40 mg total) by mouth daily. Pt must keep 06/03/18 appt for refills 30 tablet 0  . escitalopram (LEXAPRO) 10 MG tablet Take 1 tablet (10 mg total) by mouth daily. Pt must keep 06/03/18 appt for refills 30 tablet 0  . pantoprazole (PROTONIX) 40 MG tablet Take 1 tablet (40 mg total) by mouth 2 (two) times daily. 180 tablet 3   No current facility-administered medications for this visit.    No Known Allergies    Review of Systems:  Constitutional:  No  fever, no chills, No recent illness, No unintentional weight changes. No significant fatigue.   HEENT: No  headache, no vision change, no hearing change  Cardiac: No  chest pain, No  pressure, No palpitations,  Respiratory:  No  shortness of breath. No  Cough  Gastrointestinal: No  abdominal pain, No  nausea, No  vomiting,  No  blood in stool, No  diarrhea, No  constipation   Musculoskeletal: No new myalgia/arthralgia  Skin: No  Rash  Hem/Onc: No  easy bruising/bleeding,   Neurologic: No  weakness, No  dizziness,   Psychiatric: No  concerns with depression, No  concerns with anxiety, No sleep problems, No mood problems  Exam:  BP 125/77 (BP Location: Left Arm, Patient Position: Sitting, Cuff Size: Large)   Pulse 91   Temp 98.3 F (36.8 C) (Oral)   Wt 218 lb 3.2 oz (99 kg)   BMI 35.22 kg/m   Constitutional: VS see above. General Appearance: alert, well-developed, well-nourished, NAD  Eyes: Normal lids and conjunctive, non-icteric sclera  Ears, Nose, Mouth, Throat: MMM, Normal external inspection ears/nares/mouth/lips/gums. TM normal bilaterally. Pharynx/tonsils no erythema, no exudate. Nasal mucosa normal.   Neck: No masses, trachea midline. No thyroid enlargement. No tenderness/mass appreciated. No  lymphadenopathy  Respiratory: Normal respiratory effort. no wheeze, no rhonchi, no rales  Cardiovascular: S1/S2 normal, no murmur, no rub/gallop auscultated. RRR. No lower extremity edema.   Gastrointestinal: Nontender, no masses. No hepatomegaly, no splenomegaly. No hernia appreciated. Bowel sounds normal. Rectal exam deferred.   Musculoskeletal: Gait normal. No clubbing/cyanosis of digits.   Neurological: Normal balance/coordination. No tremor.    Skin: warm, dry, intact. No rash/ulcer. No concerning nevi or subq nodules on limited exam.    Psychiatric: Normal judgment/insight. Normal mood and affect. Oriented x3.      ASSESSMENT/PLAN: The primary encounter diagnosis was Annual physical exam. Diagnoses of Essential hypertension, benign, History of CVA (cerebrovascular accident) without residual deficits, Elevated ALT measurement, Anemia, unspecified type, Gastroesophageal reflux disease with esophagitis, Recurrent major depressive disorder, in full remission ([place]), and Hyperlipidemia, unspecified hyperlipidemia type were also pertinent to this visit.  Orders Placed This Encounter  Procedures  . Lipid panel  . COMPLETE METABOLIC PANEL WITH GFR   Patient Instructions  General Preventive Care  Most recent routine screening lipids/other labs: done already  Tobacco: don't! Alcohol: moderation is ok for most people. Recreational/Illicit Drugs: don't!  Exercise: as tolerated to reduce risk of cardiovascular disease and diabetes  Mental health: if need for mental health care (medicines, counseling, other), or concerns about moods, please let me know!   Sexual health: if need for STD testing, or if concern for libido/pain problems, please let me know or see your OBGYN    Vaccines  Flu vaccine: recommended every fall (by Halloween!)  Shingles vaccine: Shingrix recommended after age 17, will call you when we get this in stock in the office   Pneumonia vaccines: Prevnar and  Pneumovax recommended after age 76  Tetanus booster: Tdap recommended every 10 years.  You are good until 12/2020  Cancer screenings   Colon cancer screening: recommended at age 82, based on my records you do not need another colonoscopy until 2025  Breast cancer screening: mammogram recommended starting at age 86 annually   Cervical cancer screening: every 1 to 5 years depending on age and other risk factors. Can stop at age 31 or w/ hysterectomy as long as previous testing was normal.   Other . Bone Density Test: recommended for women at age 55, men at age 51, sooner depending on risk factors . Advanced Directive: Living Will and/or Healthcare Power of Attorney recommended for everyone, regardless of age or health . Cholesterol & Diabetes screening recommended screening annually . Thyroid and Vitamin D: routine screening not recommended, most insurance will not cover this test            Visit summary with medication list and pertinent instructions was printed for patient to review. All questions at time of visit were answered - patient instructed to contact office with any additional concerns. ER/RTC precautions were reviewed with the patient. Follow-up plan: Return in about 3 months (around 09/03/2018) for LAB VISIT ONLY. Annual  visit w/ Dr Loni Muse 04/2019.

## 2018-06-03 NOTE — Patient Instructions (Addendum)
General Preventive Care  Most recent routine screening lipids/other labs: done already  Tobacco: don't! Alcohol: moderation is ok for most people. Recreational/Illicit Drugs: don't!  Exercise: as tolerated to reduce risk of cardiovascular disease and diabetes  Mental health: if need for mental health care (medicines, counseling, other), or concerns about moods, please let me know!   Sexual health: if need for STD testing, or if concern for libido/pain problems, please let me know or see your OBGYN    Vaccines  Flu vaccine: recommended every fall (by Halloween!)  Shingles vaccine: Shingrix recommended after age 63, will call you when we get this in stock in the office   Pneumonia vaccines: Prevnar and Pneumovax recommended after age 53  Tetanus booster: Tdap recommended every 10 years.  You are good until 12/2020  Cancer screenings   Colon cancer screening: recommended at age 51, based on my records you do not need another colonoscopy until 2025  Breast cancer screening: mammogram recommended starting at age 29 annually   Cervical cancer screening: every 1 to 5 years depending on age and other risk factors. Can stop at age 56 or w/ hysterectomy as long as previous testing was normal.   Other . Bone Density Test: recommended for women at age 66, men at age 37, sooner depending on risk factors . Advanced Directive: Living Will and/or Healthcare Power of Attorney recommended for everyone, regardless of age or health . Cholesterol & Diabetes screening recommended screening annually . Thyroid and Vitamin D: routine screening not recommended, most insurance will not cover this test

## 2018-06-30 DIAGNOSIS — H35433 Paving stone degeneration of retina, bilateral: Secondary | ICD-10-CM | POA: Diagnosis not present

## 2018-06-30 DIAGNOSIS — H04123 Dry eye syndrome of bilateral lacrimal glands: Secondary | ICD-10-CM | POA: Diagnosis not present

## 2018-06-30 DIAGNOSIS — H2513 Age-related nuclear cataract, bilateral: Secondary | ICD-10-CM | POA: Diagnosis not present

## 2018-06-30 DIAGNOSIS — H1045 Other chronic allergic conjunctivitis: Secondary | ICD-10-CM | POA: Diagnosis not present

## 2018-07-02 MED FILL — ATORVASTATIN 40 MG TABLET: 40 | 90 days supply | Qty: 90 | Fill #0

## 2018-07-02 MED FILL — AMLODIPINE-BENAZEPRIL 2.5-1: 2.5-10 | 90 days supply | Qty: 90 | Fill #0

## 2018-07-08 ENCOUNTER — Ambulatory Visit (INDEPENDENT_AMBULATORY_CARE_PROVIDER_SITE_OTHER): Payer: 59 | Admitting: Osteopathic Medicine

## 2018-07-08 VITALS — Temp 98.1°F

## 2018-07-08 DIAGNOSIS — Z23 Encounter for immunization: Secondary | ICD-10-CM | POA: Diagnosis not present

## 2018-07-08 NOTE — Progress Notes (Signed)
   Subjective:    Patient ID: Margaret Stanton, female    DOB: 02/06/1961, 57 y.o.   MRN: [id number]  HPI  [name] is here for the flu vaccine and the 1st shingrix.   Review of Systems     Objective:   Physical Exam        Assessment & Plan:  Patient tolerated injection well without complications. Patient advised to schedule next injection 60 days from today.

## 2018-07-29 MED FILL — ESCITALOPRAM 10 MG TABLET: 10 | 90 days supply | Qty: 90 | Fill #0

## 2018-08-12 MED FILL — PANTOPRAZOLE SOD DR 40 MG T: 40 | 90 days supply | Qty: 180 | Fill #0

## 2018-08-12 MED FILL — ASPIRIN EC 325 MG TABLET: 325 | 90 days supply | Qty: 90 | Fill #0

## 2018-09-09 ENCOUNTER — Ambulatory Visit: Payer: 59

## 2018-09-09 ENCOUNTER — Telehealth: Payer: Self-pay

## 2018-09-09 NOTE — Telephone Encounter (Signed)
Pt has called and cancelled her second Shingrix. Pt reports that after her first Shingrix she has nausea, diarrhea, and a fever for two days. I advised pt that these are common reported side effects of vaccine and I also advised her that without both doses, she is not fully protected against shingles. Pt voices understanding and states she does not want to continue with series. FYI to PCP

## 2018-09-30 MED FILL — AMLODIPINE-BENAZEPRIL 2.5-1: 2.5-10 | 90 days supply | Qty: 90 | Fill #1

## 2018-10-29 MED FILL — ESCITALOPRAM 10 MG TABLET: 10 | 90 days supply | Qty: 90 | Fill #1

## 2018-11-06 DIAGNOSIS — Z1231 Encounter for screening mammogram for malignant neoplasm of breast: Secondary | ICD-10-CM | POA: Diagnosis not present

## 2018-11-06 LAB — HM MAMMOGRAPHY

## 2018-11-17 ENCOUNTER — Other Ambulatory Visit (HOSPITAL_COMMUNITY)
Admission: RE | Admit: 2018-11-17 | Discharge: 2018-11-17 | Disposition: A | Discharge status: Home / Self Care | Payer: 59 | Source: Ambulatory Visit | Attending: Osteopathic Medicine | Admitting: Osteopathic Medicine

## 2018-11-17 ENCOUNTER — Ambulatory Visit (INDEPENDENT_AMBULATORY_CARE_PROVIDER_SITE_OTHER): Payer: 59 | Admitting: Osteopathic Medicine

## 2018-11-17 ENCOUNTER — Ambulatory Visit (INDEPENDENT_AMBULATORY_CARE_PROVIDER_SITE_OTHER): Payer: 59

## 2018-11-17 ENCOUNTER — Other Ambulatory Visit: Payer: 59

## 2018-11-17 ENCOUNTER — Encounter: Payer: Self-pay | Admitting: Osteopathic Medicine

## 2018-11-17 VITALS — BP 141/80 | HR 76 | Temp 97.7°F | Wt 214.3 lb

## 2018-11-17 DIAGNOSIS — Z124 Encounter for screening for malignant neoplasm of cervix: Secondary | ICD-10-CM

## 2018-11-17 DIAGNOSIS — Z1239 Encounter for other screening for malignant neoplasm of breast: Secondary | ICD-10-CM | POA: Diagnosis not present

## 2018-11-17 DIAGNOSIS — R102 Pelvic and perineal pain: Secondary | ICD-10-CM | POA: Diagnosis not present

## 2018-11-17 DIAGNOSIS — D251 Intramural leiomyoma of uterus: Secondary | ICD-10-CM | POA: Diagnosis not present

## 2018-11-17 NOTE — Progress Notes (Signed)
HPI: Margaret Stanton is a 58 y.o. female who  has a past medical history of CVA (cerebral infarction), Hypertension, and Uterine fibromyoma.  she presents to [facility] today, 11/17/18,  for chief complaint of:  Concern for ovarian pain  . Context: s/p ablation for heavy menses, this was 7 years ago, no pelvic surgeries  . Location: bilateral lower abdomen/pelvic . Quality: feels like period cramping . Severity: better today, was worse lat last week (5 days ago)  . Duration: 2 weeks total  . Timing: intermittemtn . Modifying factors: advil helps . Assoc signs/symptoms:  Irritability   .    At today's visit 11/17/18 ... PMH, PSH, FH reviewed and updated as needed.  Current medication list and allergy/intolerance hx reviewed and updated as needed. (See remainder of HPI, ROS, Phys Exam below)   No results found.  No results found for this or any previous visit (from the past 72 hour(s)).        ASSESSMENT/PLAN: The primary encounter diagnosis was Pelvic pain. Diagnoses of Cervical cancer screening and Breast cancer screening were also pertinent to this visit.   Will eval for menopause / ovarian/uterine pathology consider referral to OBGYN depending on results   Orders Placed This Encounter  Procedures  . MM 3D SCREEN BREAST BILATERAL  . US Pelvis Complete  . US Transvaginal Non-OB  . FSH/LH        Follow-up plan: Return for recheck as needed otherwise annual in 05/2019.                                                 ################################################# ################################################# ################################################# #################################################    Current Meds  Medication Sig  . amlodipine-benazepril (LOTREL) 2.5-10 MG capsule Take 1 capsule by mouth daily. Pt must keep 06/03/18 appt for refills  . aspirin  325 MG EC tablet Take 1 tablet (325 mg total) by mouth daily.  Marland Kitchen atorvastatin (LIPITOR) 40 MG tablet Take 1 tablet (40 mg total) by mouth daily. Pt must keep 06/03/18 appt for refills  . escitalopram (LEXAPRO) 10 MG tablet Take 1 tablet (10 mg total) by mouth daily. Pt must keep 06/03/18 appt for refills  . pantoprazole (PROTONIX) 40 MG tablet Take 1 tablet (40 mg total) by mouth 2 (two) times daily.    No Known Allergies     Review of Systems:  Constitutional: No recent illness  HEENT: No  headache, no vision change  Cardiac: No  chest pain, No  pressure, No palpitations  Respiratory:  No  shortness of breath. No  Cough  Gastrointestinal: +lower abdominal pain, no change on bowel habits  Musculoskeletal: No new myalgia/arthralgia  Neurologic: No  weakness, No  Dizziness  Psychiatric: No  concerns with depression, +concerns with anxiety  Exam:  BP (!) 141/80 (BP Location: Left Arm, Patient Position: Sitting, Cuff Size: Normal)   Pulse 76   Temp 97.7 F (36.5 C) (Oral)   Wt 214 lb 4.8 oz (97.2 kg)   BMI 34.59 kg/m   Constitutional: VS see above. General Appearance: alert, well-developed, well-nourished, NAD  Eyes: Normal lids and conjunctive, non-icteric sclera  Ears, Nose, Mouth, Throat: MMM, Normal external inspection ears/nares/mouth/lips/gums.  Neck: No masses, trachea midline.   Respiratory: Normal respiratory effort.   Musculoskeletal: Gait normal. Symmetric and independent movement of all extremities  Abdominal: non-tender, non-distended,  no appreciable organomegaly, neg Murphy's, BS WNLx4  Neurological: Normal balance/coordination. No tremor.  Skin: warm, dry, intact.   Psychiatric: Normal judgment/insight. Normal mood and affect. Oriented x3.  GYN: No lesions/ulcers to external genitalia, normal urethra, normal vaginal mucosa, physiologic discharge, cervix normal without lesions but appears atrophic, uterus not enlarged or tender, adnexa no masses and  nontender      Visit summary with medication list and pertinent instructions was printed for patient to review, patient was advised to alert [place] if any updates are needed. All questions at time of visit were answered - patient instructed to contact office with any additional concerns. ER/RTC precautions were reviewed with the patient and understanding verbalized.   Note: Total time spent 25 minutes, greater than 50% of the visit was spent face-to-face counseling and coordinating care for the following: The primary encounter diagnosis was Pelvic pain. Diagnoses of Cervical cancer screening and Breast cancer screening were also pertinent to this visit.Marland Kitchen  Please note: voice recognition software was used to produce this document, and typos may escape review. Please contact Dr. Sheppard Coil for any needed clarifications.    Follow up plan: Return for recheck as needed otherwise annual in 05/2019.

## 2018-11-18 LAB — FSH/LH
FSH: 55 m[IU]/mL
LH: 23 m[IU]/mL

## 2018-11-19 ENCOUNTER — Other Ambulatory Visit: Payer: 59

## 2018-11-19 ENCOUNTER — Encounter: Payer: Self-pay | Admitting: Osteopathic Medicine

## 2018-11-20 LAB — CYTOLOGY - PAP
DIAGNOSIS: NEGATIVE
HPV (WINDOPATH): NOT DETECTED

## 2018-11-25 ENCOUNTER — Encounter: Payer: Self-pay | Admitting: Osteopathic Medicine

## 2018-11-25 MED FILL — ASPIRIN EC 325 MG TABLET: 325 | 90 days supply | Qty: 90 | Fill #1

## 2018-11-25 MED FILL — PANTOPRAZOLE SOD DR 40 MG T: 40 | 90 days supply | Qty: 180 | Fill #1

## 2018-11-25 MED FILL — ATORVASTATIN 40 MG TABLET: 40 | 90 days supply | Qty: 90 | Fill #1

## 2018-12-02 MED FILL — ASPIRIN EC 325 MG TABLET: 325 | 90 days supply | Qty: 90 | Fill #1

## 2018-12-02 MED FILL — PANTOPRAZOLE SOD DR 40 MG T: 40 | 90 days supply | Qty: 180 | Fill #1

## 2018-12-02 MED FILL — ATORVASTATIN 40 MG TABLET: 40 | 90 days supply | Qty: 90 | Fill #1

## 2019-01-04 MED FILL — AMLODIPINE-BENAZEPRIL 2.5-1: 2.5-10 | 90 days supply | Qty: 90 | Fill #2 | Status: TO

## 2019-01-21 MED FILL — ESCITALOPRAM 10 MG TABLET: 10 | 90 days supply | Qty: 90 | Fill #0

## 2019-01-26 ENCOUNTER — Telehealth: Payer: 59 | Admitting: Physician Assistant

## 2019-01-26 DIAGNOSIS — J019 Acute sinusitis, unspecified: Secondary | ICD-10-CM

## 2019-01-26 DIAGNOSIS — B9689 Other specified bacterial agents as the cause of diseases classified elsewhere: Secondary | ICD-10-CM | POA: Diagnosis not present

## 2019-01-26 MED ORDER — DOXYCYCLINE HYCLATE 100 MG PO CAPS: 100.0000 mg | ORAL_CAPSULE | Freq: Two times a day (BID) | ORAL | 0 refills | Status: AC

## 2019-01-26 NOTE — Progress Notes (Signed)
We are sorry that you are not feeling well.  Here is how we plan to help!  Based on what you have shared with me it looks like you have sinusitis.  Sinusitis is inflammation and infection in the sinus cavities of the head.  Based on your presentation I believe you most likely have Acute Bacterial Sinusitis.  This is an infection caused by bacteria and is treated with antibiotics. I have prescribed Doxycycline 100mg  by mouth twice a day for 10 days. You may use an oral decongestant such as Mucinex D or if you have glaucoma or high blood pressure use plain Mucinex. Saline nasal spray help and can safely be used as often as needed for congestion.  If you develop worsening sinus pain, fever or notice severe headache and vision changes, or if symptoms are not better after completion of antibiotic, please schedule an appointment with a health care provider.    Sinus infections are not as easily transmitted as other respiratory infection, however we still recommend that you avoid close contact with loved ones, especially the very young and elderly.  Remember to wash your hands thoroughly throughout the day as this is the number one way to prevent the spread of infection!  Home Care: Only take medications as instructed by your medical team. Complete the entire course of an antibiotic. Do not take these medications with alcohol. A steam or ultrasonic humidifier can help congestion.  You can place a towel over your head and breathe in the steam from hot water coming from a faucet. Avoid close contacts especially the very young and the elderly. Cover your mouth when you cough or sneeze. Always remember to wash your hands.  Get Help Right Away If: You develop worsening fever or sinus pain. You develop a severe head ache or visual changes. Your symptoms persist after you have completed your treatment plan.  Make sure you Understand these instructions. Will watch your condition. Will get help right away if  you are not doing well or get worse.  Your e-visit answers were reviewed by a board certified advanced clinical practitioner to complete your personal care plan.  Depending on the condition, your plan could have included both over the counter or prescription medications.  If there is a problem please reply once you have received a response from your provider.  Your safety is important to [place].  If you have drug allergies check your prescription carefully.    You can use MyChart to ask questions about today's visit, request a non-urgent call back, or ask for a work or school excuse for 24 hours related to this e-Visit. If it has been greater than 24 hours you will need to follow up with your provider, or enter a new e-Visit to address those concerns.  You will get an e-mail in the next two days asking about your experience.  I hope that your e-visit has been valuable and will speed your recovery. Thank you for using e-visits.    ===View-only below this line===   ----- Message -----    From: Margaret Stanton    Sent: 01/26/2019 11:11 AM EDT      To: E-Visit Mailing List Subject: E-Visit Submission: Sinus Problems  E-Visit Submission: Sinus Problems --------------------------------  Question: Which of the following have you been experiencing? Answer:   Congested nose            Pain around the nose and face  Headache            Ear pain            Neck pain  Question: Have these symptoms significantly worsened over the last two to three days? Answer:   No  Question: Have you had any of the following? Answer:   None of the above  Question: How long have you been having these symptoms? Answer:   7 or more days  Question: Do you have a fever? Answer:   No, I do not have a fever  Question: Do you smoke? Answer:   No  Question: Have you ever smoked? Answer:   I have never smoked  Question: Do you have any chronic illnesses, such as diabetes, heart disease, kidney  disease, or lung disease, or any illness that would weaken your body's ability to fight infection? Answer:   No  Question: When you blow your nose, what color is the mucus? Answer:   Mostly thin and yellow or green  Question: Have you experienced similar problems in the past? Answer:   Yes  Question: What treatments have worked in the past?  Answer:   I typically experience chronic seasonal allergies.  I have experienced a sinus headache for >2 weeks with sinus drainage and ear pain and popping.  My neck and shoulders are also tight and sore.  I am currently using Flonase Sensimist and it helps with sinus blockage but symptoms have been persistent.  Question: What treatment(s) in the past have been unsuccessful? Answer:     Question: Is this illness similar to previous illnesses you have had?  How is it the same?  How is it different? Answer:   This is typical for me this time of year.  In the past, I would have a sinus infection that would often move to my chest.  That has not occurred at this time.  It is all still in my head.  Question: Have you recently been hospitalized? Answer:   No  Question: What medications are you currently taking for these symptoms? Answer:   Pain medicine            Nose spray  Question: Please enter the names of any medications you are taking, or any other treatments you are trying. Answer:   Flonase and Tylenol for headache  Question: Please list your medication allergies that you may have ? (If 'none' , please list as 'none') Answer:   None.  Question: Please list any additional comments  Answer:     A total of 5-10 minutes was spent evaluating this patients questionnaire and formulating a plan of care.

## 2019-02-01 ENCOUNTER — Telehealth: Payer: 59 | Admitting: Internal Medicine

## 2019-02-01 DIAGNOSIS — L237 Allergic contact dermatitis due to plants, except food: Secondary | ICD-10-CM | POA: Diagnosis not present

## 2019-02-01 MED ORDER — METHYLPREDNISOLONE 4 MG PO TBPK: ORAL_TABLET | ORAL | 0 refills | Status: DC

## 2019-02-01 MED ORDER — PREDNISONE 10 MG (21) PO TBPK: ORAL_TABLET | ORAL | 0 refills | Status: DC

## 2019-02-01 NOTE — Progress Notes (Signed)

## 2019-02-17 ENCOUNTER — Encounter: Payer: Self-pay | Admitting: Osteopathic Medicine

## 2019-03-02 ENCOUNTER — Ambulatory Visit (INDEPENDENT_AMBULATORY_CARE_PROVIDER_SITE_OTHER): Payer: 59 | Admitting: Osteopathic Medicine

## 2019-03-02 ENCOUNTER — Other Ambulatory Visit: Payer: Self-pay

## 2019-03-02 ENCOUNTER — Encounter: Payer: Self-pay | Admitting: Osteopathic Medicine

## 2019-03-02 VITALS — Temp 99.0°F

## 2019-03-02 DIAGNOSIS — F418 Other specified anxiety disorders: Secondary | ICD-10-CM

## 2019-03-02 MED ORDER — CLONAZEPAM 0.5 MG PO TABS: 0.2500 mg | ORAL_TABLET | Freq: Two times a day (BID) | ORAL | 1 refills | Status: DC | PRN

## 2019-03-02 NOTE — Progress Notes (Signed)
Virtual Visit via Video (App used: WebEx) Note  I connected with      Margaret Stanton on 03/02/19 at 2:00 by a telemedicine application and verified that I am speaking with the correct person using two identifiers.  Patient is working from home  I am working in office   I discussed the limitations of evaluation and management by telemedicine and the availability of in person appointments. The patient expressed understanding and agreed to proceed.  History of Present Illness: Margaret Stanton is a 58 y.o. female who would like to discuss anxiety  Lexapro 10 mg for awhile has been woring well Increased anxiety w/ pandemic Then mom, who usually lives w/ Margaret Stanton, was in [facility] visiting family and suffered a fall and fracture, Tami has been coordinating care from up here  Feels crying all the time Difficulty working through everything     GAD 7 : Generalized Anxiety Score 03/02/2019 11/17/2018 06/03/2018 05/16/2017  Nervous, Anxious, on Edge 3 1 0 0  Control/stop worrying 3 0 0 0  Worry too much - different things 3 0 0 0  Trouble relaxing 3 0 0 0  Restless 2 0 0 0  Easily annoyed or irritable 0 1 0 0  Afraid - awful might happen 3 0 0 0  Total GAD 7 Score 17 2 0 0  Anxiety Difficulty Not difficult at all Somewhat difficult - -    Depression screen [facility] 2/9 03/02/2019 11/17/2018 06/03/2018  Decreased Interest 1 0 0  Down, Depressed, Hopeless 0 0 0  PHQ - 2 Score 1 0 0  Altered sleeping - 0 0  Tired, decreased energy - 0 1  Change in appetite - 1 1  Feeling bad or failure about yourself  - 0 0  Trouble concentrating - 0 0  Moving slowly or fidgety/restless - 0 0  Suicidal thoughts - 0 0  PHQ-9 Score - 1 2  Difficult doing work/chores - Not difficult at all Not difficult at all      Observations/Objective: Temp 99 F (37.2 C) (Oral)  BP Readings from Last 3 Encounters:  11/17/18 (!) 141/80  06/03/18 125/77  05/16/17 120/81   Exam: Normal Speech.  Occasionally tearful  Lab and  Radiology Results No results found for this or any previous visit (from the past 72 hour(s)). No results found.        Assessment and Plan: 58 y.o. female with The encounter diagnosis was Situational anxiety.  Trial benzo anxiolytic  Advised on appropriate prn use to avoid dependence Consider increase Lexapro if needed Close follow-up   PDMP not reviewed this encounter. No orders of the defined types were placed in this encounter.  Meds ordered this encounter  Medications  . clonazePAM (KLONOPIN) 0.5 MG tablet    Sig: Take 0.5-1 tablets (0.25-0.5 mg total) by mouth 2 (two) times daily as needed for anxiety. Sparing use to prevent tolerance/dependence    Dispense:  20 tablet    Refill:  1   There are no Patient Instructions on file for this visit.  Instructions sent via MyChart. If MyChart not available, pt was given option for info via personal e-mail w/ no guarantee of protected health info over unsecured e-mail communication, and MyChart sign-up instructions were included.   Follow Up Instructions: Return for recheck anxiety / mental health in 7-10 days or sooner if needed .    I discussed the assessment and treatment plan with the patient. The patient was provided an  opportunity to ask questions and all were answered. The patient agreed with the plan and demonstrated an understanding of the instructions.   The patient was advised to call back or seek an in-person evaluation if any new concerns, if symptoms worsen or if the condition fails to improve as anticipated.  21 minutes of non-face-to-face time was provided during this encounter.                      Historical information moved to improve visibility of documentation.  Past Medical History:  Diagnosis Date  . CVA (cerebral infarction)    2013  . Hypertension   . Uterine fibromyoma    Past Surgical History:  Procedure Laterality Date  . ABLATION  01-2012   uterine  . CHOLECYSTECTOMY     . CHOLECYSTECTOMY, LAPAROSCOPIC  07-2006   Social History   Tobacco Use  . Smoking status: Never Smoker  . Smokeless tobacco: Never Used  Substance Use Topics  . Alcohol use: Yes    Comment: 1 glass of wine about every 2 months   family history includes Diabetes in her mother; Heart attack in her father; Hypertension in her unknown relative.  Medications: Current Outpatient Medications  Medication Sig Dispense Refill  . amlodipine-benazepril (LOTREL) 2.5-10 MG capsule Take 1 capsule by mouth daily. Pt must keep 06/03/18 appt for refills 90 capsule 3  . aspirin 325 MG EC tablet Take 1 tablet (325 mg total) by mouth daily. 90 tablet 3  . atorvastatin (LIPITOR) 40 MG tablet Take 1 tablet (40 mg total) by mouth daily. Pt must keep 06/03/18 appt for refills 90 tablet 3  . escitalopram (LEXAPRO) 10 MG tablet Take 1 tablet (10 mg total) by mouth daily. Pt must keep 06/03/18 appt for refills 90 tablet 3  . pantoprazole (PROTONIX) 40 MG tablet Take 1 tablet (40 mg total) by mouth 2 (two) times daily. 180 tablet 3  . predniSONE (STERAPRED UNI-PAK 21 TAB) 10 MG (21) TBPK tablet As directed 21 tablet 0  . clonazePAM (KLONOPIN) 0.5 MG tablet Take 0.5-1 tablets (0.25-0.5 mg total) by mouth 2 (two) times daily as needed for anxiety. Sparing use to prevent tolerance/dependence 20 tablet 1   No current facility-administered medications for this visit.    No Known Allergies  PDMP not reviewed this encounter. No orders of the defined types were placed in this encounter.  Meds ordered this encounter  Medications  . clonazePAM (KLONOPIN) 0.5 MG tablet    Sig: Take 0.5-1 tablets (0.25-0.5 mg total) by mouth 2 (two) times daily as needed for anxiety. Sparing use to prevent tolerance/dependence    Dispense:  20 tablet    Refill:  1

## 2019-03-03 ENCOUNTER — Telehealth: Payer: Self-pay | Admitting: Osteopathic Medicine

## 2019-03-03 NOTE — Telephone Encounter (Signed)
-----   Message from Emeterio Reeve, DO sent at 03/02/2019  2:37 PM EDT ----- 7-10 day follow up recheck anxiety virtual visit ok thanks!

## 2019-03-03 NOTE — Telephone Encounter (Signed)
I called pt and left VM for her to call back and schedule a 7-10 day f/u for a recheck with Dr.Alexander virtually

## 2019-03-04 MED FILL — ATORVASTATIN 40 MG TABLET: 40 | 90 days supply | Qty: 90 | Fill #0

## 2019-03-04 MED FILL — ASPIRIN EC 325 MG TABLET: 325 | 90 days supply | Qty: 90 | Fill #0

## 2019-03-04 MED FILL — PANTOPRAZOLE SOD DR 40 MG T: 40 | 90 days supply | Qty: 180 | Fill #0

## 2019-03-10 ENCOUNTER — Encounter: Payer: Self-pay | Admitting: Osteopathic Medicine

## 2019-03-10 ENCOUNTER — Ambulatory Visit (INDEPENDENT_AMBULATORY_CARE_PROVIDER_SITE_OTHER): Payer: 59 | Admitting: Osteopathic Medicine

## 2019-03-10 VITALS — Temp 97.9°F | Wt 210.0 lb

## 2019-03-10 DIAGNOSIS — F418 Other specified anxiety disorders: Secondary | ICD-10-CM

## 2019-03-10 NOTE — Progress Notes (Signed)
Virtual Visit via Video (App used: WebEx) Note  I connected with      Margaret Stanton on 03/10/19 at 11:25 by a telemedicine application and verified that I am speaking with the correct person using two identifiers.  Patient is at home  I am in office    I discussed the limitations of evaluation and management by telemedicine and the availability of in person appointments. The patient expressed understanding and agreed to proceed.  History of Present Illness: Margaret Stanton is a 58 y.o. female who would like to discuss follow up anxiety    As of recent visit 2 weeks ago 03/01/09: Lexapro 10 mg for awhile has been woring well Increased anxiety w/ pandemic Then mom, who usually lives w/ Dalynn, was in [facility] visiting family and suffered a fall and fracture, Itati has been coordinating care from up here  Feels crying all the time Difficulty working through everything  Trial benzo anxiolytic  Advised on appropriate prn use to avoid dependence Consider increase Lexapro if needed Close follow-up    Today, pt reports that having medication avialbale is helping, she is feeling better, she has not taken the clonazepam since last week. Mom is placed now and recovering fairly well. A whole tablet caused drowsiness but she took half bid for a few days then dialy and has taken none in about 3 days.      GAD 7 : Generalized Anxiety Score 03/10/2019 03/02/2019 11/17/2018 06/03/2018  Nervous, Anxious, on Edge 1 3 1  0  Control/stop worrying 0 3 0 0  Worry too much - different things 1 3 0 0  Trouble relaxing 1 3 0 0  Restless 0 2 0 0  Easily annoyed or irritable 0 0 1 0  Afraid - awful might happen 1 3 0 0  Total GAD 7 Score 4 17 2  0  Anxiety Difficulty Not difficult at all Not difficult at all Somewhat difficult -    Depression screen [facility] 2/9 03/10/2019 03/02/2019 11/17/2018  Decreased Interest 0 1 0  Down, Depressed, Hopeless 1 0 0  PHQ - 2 Score 1 1 0  Altered sleeping - - 0  Tired, decreased  energy - - 0  Change in appetite - - 1  Feeling bad or failure about yourself  - - 0  Trouble concentrating - - 0  Moving slowly or fidgety/restless - - 0  Suicidal thoughts - - 0  PHQ-9 Score - - 1  Difficult doing work/chores - - Not difficult at all        Observations/Objective: Temp 97.9 F (36.6 C) (Oral)   Wt 210 lb (95.3 kg)   BMI 33.89 kg/m  BP Readings from Last 3 Encounters:  11/17/18 (!) 141/80  06/03/18 125/77  05/16/17 120/81   Exam: Normal Speech.  NAD  Lab and Radiology Results No results found for this or any previous visit (from the past 72 hour(s)). No results found.     Assessment and Plan: 58 y.o. female with The encounter diagnosis was Situational anxiety.  OK to continue current medicines on as-needed basis, pt is doing well w/ calming techniques and limited medication use.   PDMP not reviewed this encounter. No orders of the defined types were placed in this encounter.  No orders of the defined types were placed in this encounter.  There are no Patient Instructions on file for this visit.  Instructions sent via MyChart. If MyChart not available, pt was given option for info  via personal e-mail w/ no guarantee of protected health info over unsecured e-mail communication, and MyChart sign-up instructions were included.   Follow Up Instructions: Return for annual physical due around 05/2019.    I discussed the assessment and treatment plan with the patient. The patient was provided an opportunity to ask questions and all were answered. The patient agreed with the plan and demonstrated an understanding of the instructions.   The patient was advised to call back or seek an in-person evaluation if any new concerns, if symptoms worsen or if the condition fails to improve as anticipated.  10 minutes of non-face-to-face time was provided during this encounter.                      Historical information moved to improve  visibility of documentation.  Past Medical History:  Diagnosis Date  . CVA (cerebral infarction)    2013  . Hypertension   . Uterine fibromyoma    Past Surgical History:  Procedure Laterality Date  . ABLATION  01-2012   uterine  . CHOLECYSTECTOMY    . CHOLECYSTECTOMY, LAPAROSCOPIC  07-2006   Social History   Tobacco Use  . Smoking status: Never Smoker  . Smokeless tobacco: Never Used  Substance Use Topics  . Alcohol use: Yes    Comment: 1 glass of wine about every 2 months   family history includes Diabetes in her mother; Heart attack in her father; Hypertension in her unknown relative.  Medications: Current Outpatient Medications  Medication Sig Dispense Refill  . amlodipine-benazepril (LOTREL) 2.5-10 MG capsule Take 1 capsule by mouth daily. Pt must keep 06/03/18 appt for refills 90 capsule 3  . aspirin 325 MG EC tablet Take 1 tablet (325 mg total) by mouth daily. 90 tablet 3  . atorvastatin (LIPITOR) 40 MG tablet Take 1 tablet (40 mg total) by mouth daily. Pt must keep 06/03/18 appt for refills 90 tablet 3  . clonazePAM (KLONOPIN) 0.5 MG tablet Take 0.5-1 tablets (0.25-0.5 mg total) by mouth 2 (two) times daily as needed for anxiety. Sparing use to prevent tolerance/dependence 20 tablet 1  . escitalopram (LEXAPRO) 10 MG tablet Take 1 tablet (10 mg total) by mouth daily. Pt must keep 06/03/18 appt for refills 90 tablet 3  . pantoprazole (PROTONIX) 40 MG tablet Take 1 tablet (40 mg total) by mouth 2 (two) times daily. 180 tablet 3   No current facility-administered medications for this visit.    No Known Allergies  PDMP not reviewed this encounter. No orders of the defined types were placed in this encounter.  No orders of the defined types were placed in this encounter.

## 2019-03-30 MED FILL — AMLODIPINE-BENAZEPRIL 2.5-1: 2.5-10 | 90 days supply | Qty: 90 | Fill #0

## 2019-04-29 MED FILL — ESCITALOPRAM 10 MG TABLET: 10 | 90 days supply | Qty: 90 | Fill #0

## 2019-06-02 MED FILL — ATORVASTATIN 40 MG TABLET: 40 | 90 days supply | Qty: 90 | Fill #0

## 2019-06-02 MED FILL — PANTOPRAZOLE SOD DR 40 MG T: 40 | 90 days supply | Qty: 180 | Fill #0

## 2019-06-02 MED FILL — ASPIRIN EC 325 MG TABLET: 325 | 90 days supply | Qty: 90 | Fill #0

## 2019-06-03 ENCOUNTER — Telehealth: Payer: 59 | Admitting: Physician Assistant

## 2019-06-03 DIAGNOSIS — J019 Acute sinusitis, unspecified: Secondary | ICD-10-CM | POA: Diagnosis not present

## 2019-06-03 MED ORDER — AZELASTINE HCL 0.1 % NA SOLN: 2.0000 | Freq: Two times a day (BID) | NASAL | 0 refills | Status: DC

## 2019-06-03 NOTE — Progress Notes (Signed)
We are sorry that you are not feeling well.  Here is how we plan to help!  Based on what you have shared with me it looks like you have sinusitis.  Sinusitis is inflammation and infection in the sinus cavities of the head.  Based on your presentation I believe you most likely have Acute Viral Sinusitis.This is an infection most likely caused by a virus. There is not specific treatment for viral sinusitis other than to help you with the symptoms until the infection runs its course.  You may use an oral decongestant such as Mucinex D or if you have glaucoma or high blood pressure use plain Mucinex. Saline nasal spray help and can safely be used as often as needed for congestion, I have prescribed: Azelastine nasal spray 2 sprays in each nostril twice a day. You can use this with flonase. I would also suggest using over the counter nasal saline rinse before using flonase and azelastine spray. I would also add on an over the counter oral antihistamine, such as zyrtec or Claritin.   Some authorities believe that zinc sprays or the use of Echinacea may shorten the course of your symptoms.  Sinus infections are not as easily transmitted as other respiratory infection, however we still recommend that you avoid close contact with loved ones, especially the very young and elderly.  Remember to wash your hands thoroughly throughout the day as this is the number one way to prevent the spread of infection!  Home Care:  Only take medications as instructed by your medical team.  Do not take these medications with alcohol.  A steam or ultrasonic humidifier can help congestion.  You can place a towel over your head and breathe in the steam from hot water coming from a faucet.  Avoid close contacts especially the very young and the elderly.  Cover your mouth when you cough or sneeze.  Always remember to wash your hands.  Get Help Right Away If:  You develop worsening fever or sinus pain.  You develop a  severe head ache or visual changes.  Your symptoms persist after you have completed your treatment plan.  Make sure you  Understand these instructions.  Will watch your condition.  Will get help right away if you are not doing well or get worse.  Your e-visit answers were reviewed by a board certified advanced clinical practitioner to complete your personal care plan.  Depending on the condition, your plan could have included both over the counter or prescription medications.  If there is a problem please reply  once you have received a response from your provider.  Your safety is important to [place].  If you have drug allergies check your prescription carefully.    You can use MyChart to ask questions about today's visit, request a non-urgent call back, or ask for a work or school excuse for 24 hours related to this e-Visit. If it has been greater than 24 hours you will need to follow up with your provider, or enter a new e-Visit to address those concerns.  You will get an e-mail in the next two days asking about your experience.  I hope that your e-visit has been valuable and will speed your recovery. Thank you for using e-visits.   I have spent 5 minutes in review of e-visit questionnaire, review and updating patient chart, medical decision making and response to patient.    Tenna Delaine, PA-C

## 2019-06-30 ENCOUNTER — Encounter: Payer: Self-pay | Admitting: Osteopathic Medicine

## 2019-06-30 ENCOUNTER — Other Ambulatory Visit: Payer: Self-pay

## 2019-06-30 ENCOUNTER — Ambulatory Visit (INDEPENDENT_AMBULATORY_CARE_PROVIDER_SITE_OTHER): Payer: 59 | Admitting: Osteopathic Medicine

## 2019-06-30 VITALS — BP 135/78 | HR 79 | Temp 98.2°F | Wt 210.7 lb

## 2019-06-30 DIAGNOSIS — Z8673 Personal history of transient ischemic attack (TIA), and cerebral infarction without residual deficits: Secondary | ICD-10-CM

## 2019-06-30 DIAGNOSIS — Z862 Personal history of diseases of the blood and blood-forming organs and certain disorders involving the immune mechanism: Secondary | ICD-10-CM

## 2019-06-30 DIAGNOSIS — I10 Essential (primary) hypertension: Secondary | ICD-10-CM

## 2019-06-30 DIAGNOSIS — Z23 Encounter for immunization: Secondary | ICD-10-CM

## 2019-06-30 DIAGNOSIS — Z0000 Encounter for general adult medical examination without abnormal findings: Secondary | ICD-10-CM | POA: Diagnosis not present

## 2019-06-30 MED ORDER — ALPRAZOLAM 0.5 MG PO TABS: 0.2500 mg | ORAL_TABLET | Freq: Two times a day (BID) | ORAL | 0 refills | Status: DC | PRN

## 2019-06-30 MED FILL — ALPRAZOLAM 0.5 MG TABS: 0.5 | 15 days supply | Qty: 30 | Fill #0

## 2019-06-30 NOTE — Progress Notes (Signed)
HPI: Margaret Stanton is a 58 y.o. female who  has a past medical history of CVA (cerebral infarction), Hypertension, and Uterine fibromyoma.  she presents to [facility] today, 06/30/19,  for chief complaint of: Annual physical  Patient here today for annual physical.  Overall doing well since last visit.  Under a good deal of stress with her ill mother.  Reports that the antianxiety medication is overall working, the Klonopin seems to give her a headache a couple of hours after taking it though.    Past medical, surgical, social and family history reviewed:  Patient Active Problem List   Diagnosis Date Noted  . Anemia 05/14/2016  . Contusion of rib on left side 08/17/2015  . 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 Common bile duct (CBD) obstruction 09/13/2014  . Migraines 05/02/2014  . Essential hypertension, benign 04/21/2014  . Hyperlipidemia LDL goal <100 04/21/2014  . History of CVA (cerebrovascular accident) without residual deficits 04/21/2014  . Depression 04/21/2014  . GERD (gastroesophageal reflux disease) 04/21/2014  . Stricture and stenosis of esophagus 04/12/2014    Past Surgical History:  Procedure Laterality Date  . ABLATION  01-2012   uterine  . CHOLECYSTECTOMY    . CHOLECYSTECTOMY, LAPAROSCOPIC  07-2006    Social History   Tobacco Use  . Smoking status: Never Smoker  . Smokeless tobacco: Never Used  Substance Use Topics  . Alcohol use: Yes    Comment: 1 glass of wine about every 2 months    Family History  Problem Relation Age of Onset  . Heart attack Father   . Diabetes Mother   . Hypertension Unknown        parents  . Colon cancer Neg Hx   . Pancreatic cancer Neg Hx   . Rectal cancer Neg Hx   . Stomach cancer Neg Hx      Current medication list and allergy/intolerance information reviewed:    Current Outpatient Medications  Medication Sig Dispense Refill  . amlodipine-benazepril (LOTREL) 2.5-10 MG  capsule Take 1 capsule by mouth daily. Pt must keep 06/03/18 appt for refills 90 capsule 3  . aspirin 325 MG EC tablet Take 1 tablet (325 mg total) by mouth daily. 90 tablet 3  . atorvastatin (LIPITOR) 40 MG tablet Take 1 tablet (40 mg total) by mouth daily. Pt must keep 06/03/18 appt for refills 90 tablet 3  . clonazePAM (KLONOPIN) 0.5 MG tablet Take 0.5-1 tablets (0.25-0.5 mg total) by mouth 2 (two) times daily as needed for anxiety. Sparing use to prevent tolerance/dependence 20 tablet 1  . escitalopram (LEXAPRO) 10 MG tablet Take 1 tablet (10 mg total) by mouth daily. Pt must keep 06/03/18 appt for refills 90 tablet 3  . pantoprazole (PROTONIX) 40 MG tablet Take 1 tablet (40 mg total) by mouth 2 (two) times daily. 180 tablet 3  . azelastine (ASTELIN) 0.1 % nasal spray Place 2 sprays into both nostrils 2 (two) times daily. Use in each nostril as directed (Patient not taking: Reported on 06/30/2019) 30 mL 0   No current facility-administered medications for this visit.     No Known Allergies    Review of Systems:  Constitutional:  No  fever, no chills, No recent illness, No unintentional weight changes. No significant fatigue.   HEENT: No  headache, no vision change, no hearing change, No sore throat, No  sinus pressure  Cardiac: No  chest pain, No  pressure, No palpitations, No  Orthopnea  Respiratory:  No  shortness of breath. No  Cough  Gastrointestinal: No  abdominal pain, No  nausea, No  vomiting,  No  blood in stool, No  diarrhea, No  constipation   Musculoskeletal: No new myalgia/arthralgia  Skin: No  Rash, No other wounds/concerning lesions  Genitourinary: No  incontinence, No  abnormal genital bleeding, No abnormal genital discharge  Hem/Onc: No  easy bruising/bleeding, No  abnormal lymph node  Endocrine: No cold intolerance,  No heat intolerance. No polyuria/polydipsia/polyphagia   Neurologic: No  weakness, No  dizziness, No  slurred speech/focal weakness/facial  droop  Psychiatric: +concerns with depression, +concerns with anxiety, No sleep problems, No mood problems  Exam:  BP 135/78 (BP Location: Left Arm, Patient Position: Sitting, Cuff Size: Large)   Pulse 79   Temp 98.2 F (36.8 C) (Oral)   Wt 210 lb 11.2 oz (95.6 kg)   BMI 34.01 kg/m   Constitutional: VS see above. General Appearance: alert, well-developed, well-nourished, NAD  Eyes: Normal lids and conjunctive, non-icteric sclera  Ears, Nose, Mouth, Throat:TM normal bilaterally.   Neck: No masses, trachea midline. No thyroid enlargement. No tenderness/mass appreciated. No lymphadenopathy  Respiratory: Normal respiratory effort. no wheeze, no rhonchi, no rales  Cardiovascular: S1/S2 normal, no murmur, no rub/gallop auscultated. RRR.   Gastrointestinal: Nontender, no masses. No hepatomegaly, no splenomegaly. No hernia appreciated. Bowel sounds normal. Rectal exam deferred.   Musculoskeletal: Gait normal. No clubbing/cyanosis of digits.   Neurological: Normal balance/coordination. No tremor. No cranial nerve deficit on limited exam. Motor and sensation intact and symmetric. Cerebellar reflexes intact.   Skin: warm, dry, intact. No rash/ulcer. No concerning nevi or subq nodules on limited exam.    Psychiatric: Normal judgment/insight. Normal mood and affect. Oriented x3.           ASSESSMENT/PLAN: The primary encounter diagnosis was Need for influenza vaccination. Diagnoses of Annual physical exam, Essential hypertension, benign, History of CVA (cerebrovascular accident) without residual deficits, and History of anemia were also pertinent to this visit.   Orders Placed This Encounter  Procedures  . Flu Vaccine QUAD 6+ mos PF IM (Fluarix Quad PF)  . CBC  . COMPLETE METABOLIC PANEL WITH GFR  . Lipid panel  . Fe+TIBC+Fer   Trial swtich benzo from Klonopin which was causing some mild headache issues  Meds ordered this encounter  Medications  . ALPRAZolam (XANAX) 0.5  MG tablet    Sig: Take 0.5-1 tablets (0.25-0.5 mg total) by mouth 2 (two) times daily as needed for anxiety.    Dispense:  30 tablet    Refill:  0    Patient Instructions   General Preventive Care  Most recent routine screening lipids/other labs: ordered today   Tobacco: don't!  Alcohol: responsible moderation is ok for most adults - if you have concerns about your alcohol intake, please talk to me!   Exercise: as tolerated to reduce risk of cardiovascular disease and diabetes. Strength training will also prevent osteoporosis.   Mental health: if need for mental health care (medicines, counseling, other), or concerns about moods, please let me know!   Sexual health: if need for STD testing, or if concerns with libido/pain problems, please let me know!  Advanced Directive: Living Will and/or Healthcare Power of Attorney recommended for all adults, regardless of age or health.  Vaccines  Flu vaccine: recommended for almost everyone, every fall.   Shingles vaccine: Shingrix recommended after age 53. You've had one vaccine but not the second so you may be incompletely  protected.   Pneumonia vaccines: Prevnar and Pneumovax recommended after age 44, or sooner if certain medical conditions.  Tetanus booster: Tdap recommended every 10 years.  Cancer screenings   Colon cancer screening: colonoscopy will be due 2025.   Breast cancer screening: mammogram recommended annually after age 69. Due 10/2019.   Cervical cancer screening: Pap every 1 to 5 years depending on age and other risk factors. You'll be due 2025. Can usually stop at age 77 or w/ hysterectomy.   Lung cancer screening: not needed for non-smokers  Infection screenings . HIV: recommended screening at least once age 28-65, more often as needed. . Gonorrhea/Chlamydia: screening as needed. . Hepatitis C: recommended for anyone born 58-1963 . TB: certain at-risk populations, or depending on work requirements and/or travel  history Other . Bone Density Test: recommended for women at age 16, sooner depending on risk factors      Immunization History  Administered Date(s) Administered  . Influenza,inj,Quad PF,6+ Mos 07/08/2018, 06/30/2019  . Influenza-Unspecified 08/06/2016  . Td 01/23/2011  . Zoster Recombinat (Shingrix) 07/08/2018           Visit summary with medication list and pertinent instructions was printed for patient to review. All questions at time of visit were answered - patient instructed to contact office with any additional concerns or updates. ER/RTC precautions were reviewed with the patient.    Please note: voice recognition software was used to produce this document, and typos may escape review. Please contact Dr. Sheppard Coil for any needed clarifications.     Follow-up plan: Return in about 1 year (around 06/29/2020) for [place] (call week prior to visit for lab orders). See me sooner if needed! Marland Kitchen

## 2019-06-30 NOTE — Patient Instructions (Addendum)
General Preventive Care  Most recent routine screening lipids/other labs: ordered today   Tobacco: don't!  Alcohol: responsible moderation is ok for most adults - if you have concerns about your alcohol intake, please talk to me!   Exercise: as tolerated to reduce risk of cardiovascular disease and diabetes. Strength training will also prevent osteoporosis.   Mental health: if need for mental health care (medicines, counseling, other), or concerns about moods, please let me know!   Sexual health: if need for STD testing, or if concerns with libido/pain problems, please let me know!  Advanced Directive: Living Will and/or Healthcare Power of Attorney recommended for all adults, regardless of age or health.  Vaccines  Flu vaccine: recommended for almost everyone, every fall.   Shingles vaccine: Shingrix recommended after age 79. You've had one vaccine but not the second so you may be incompletely protected.   Pneumonia vaccines: Prevnar and Pneumovax recommended after age 73, or sooner if certain medical conditions.  Tetanus booster: Tdap recommended every 10 years.  Cancer screenings   Colon cancer screening: colonoscopy will be due 2025.   Breast cancer screening: mammogram recommended annually after age 16. Due 10/2019.   Cervical cancer screening: Pap every 1 to 5 years depending on age and other risk factors. You'll be due 2025. Can usually stop at age 92 or w/ hysterectomy.   Lung cancer screening: not needed for non-smokers  Infection screenings . HIV: recommended screening at least once age 84-65, more often as needed. . Gonorrhea/Chlamydia: screening as needed. . Hepatitis C: recommended for anyone born 70-1963 . TB: certain at-risk populations, or depending on work requirements and/or travel history Other . Bone Density Test: recommended for women at age 65, sooner depending on risk factors      Immunization History  Administered Date(s) Administered  .  Influenza,inj,Quad PF,6+ Mos 07/08/2018, 06/30/2019  . Influenza-Unspecified 08/06/2016  . Td 01/23/2011  . Zoster Recombinat (Shingrix) 07/08/2018

## 2019-07-01 LAB — COMPLETE METABOLIC PANEL WITH GFR
AG Ratio: 1.8 (calc) (ref 1.0–2.5)
ALT: 17 U/L (ref 6–29)
AST: 19 U/L (ref 10–35)
Albumin: 4.4 g/dL (ref 3.6–5.1)
Alkaline phosphatase (APISO): 86 U/L (ref 37–153)
BUN: 15 mg/dL (ref 7–25)
CO2: 31 mmol/L (ref 20–32)
Calcium: 8.9 mg/dL (ref 8.6–10.4)
Chloride: 103 mmol/L (ref 98–110)
Creat: 0.83 mg/dL (ref 0.50–1.05)
GFR, Est African American: 91 mL/min/{1.73_m2} (ref >=60)
GFR, Est Non African American: 79 mL/min/{1.73_m2} (ref >=60)
Globulin: 2.4 g/dL (calc) (ref 1.9–3.7)
Glucose, Bld: 96 mg/dL (ref 65–99)
Potassium: 3.7 mmol/L (ref 3.5–5.3)
Sodium: 141 mmol/L (ref 135–146)
Total Bilirubin: 0.5 mg/dL (ref 0.2–1.2)
Total Protein: 6.8 g/dL (ref 6.1–8.1)

## 2019-07-01 LAB — LIPID PANEL
Cholesterol: 155 mg/dL (ref <200)
HDL: 54 mg/dL (ref >=50)
LDL Cholesterol (Calc): 87 mg/dL (calc)
Non-HDL Cholesterol (Calc): 101 mg/dL (calc) (ref <130)
Total CHOL/HDL Ratio: 2.9 (calc) (ref <5.0)
Triglycerides: 56 mg/dL (ref <150)

## 2019-07-01 LAB — CBC
HCT: 29.8 % — ABNORMAL LOW (ref 35.0–45.0)
Hemoglobin: 10 g/dL — ABNORMAL LOW (ref 11.7–15.5)
MCH: 29.9 pg (ref 27.0–33.0)
MCHC: 33.6 g/dL (ref 32.0–36.0)
MCV: 89.2 fL (ref 80.0–100.0)
MPV: 10 fL (ref 7.5–12.5)
Platelets: 231 10*3/uL (ref 140–400)
RBC: 3.34 10*6/uL — ABNORMAL LOW (ref 3.80–5.10)
RDW: 13.1 % (ref 11.0–15.0)
WBC: 6.1 10*3/uL (ref 3.8–10.8)

## 2019-07-01 LAB — IRON,TIBC AND FERRITIN PANEL
%SAT: 17 % (calc) (ref 16–45)
Ferritin: 54 ng/mL (ref 16–232)
Iron: 47 ug/dL (ref 45–160)
TIBC: 269 mcg/dL (calc) (ref 250–450)

## 2019-07-07 ENCOUNTER — Other Ambulatory Visit: Payer: Self-pay | Admitting: Osteopathic Medicine

## 2019-07-07 DIAGNOSIS — I10 Essential (primary) hypertension: Secondary | ICD-10-CM

## 2019-07-07 MED FILL — AMLODIPINE-BENAZEPRIL 2.5-1: 2.5-10 | 90 days supply | Qty: 90 | Fill #0

## 2019-07-29 ENCOUNTER — Other Ambulatory Visit: Payer: Self-pay | Admitting: Osteopathic Medicine

## 2019-07-29 DIAGNOSIS — F3342 Major depressive disorder, recurrent, in full remission: Secondary | ICD-10-CM

## 2019-07-29 MED FILL — ESCITALOPRAM 10 MG TABLET: 10 | 90 days supply | Qty: 90 | Fill #0

## 2019-09-16 ENCOUNTER — Other Ambulatory Visit: Payer: Self-pay | Admitting: Osteopathic Medicine

## 2019-09-16 DIAGNOSIS — Z8673 Personal history of transient ischemic attack (TIA), and cerebral infarction without residual deficits: Secondary | ICD-10-CM

## 2019-09-16 DIAGNOSIS — I10 Essential (primary) hypertension: Secondary | ICD-10-CM

## 2019-09-16 MED FILL — ASPIRIN EC 325 MG TABLET: 325 | 90 days supply | Qty: 90 | Fill #0

## 2019-09-16 MED FILL — ATORVASTATIN 40 MG TABLET: 40 | 90 days supply | Qty: 90 | Fill #0

## 2019-09-29 ENCOUNTER — Other Ambulatory Visit: Payer: Self-pay | Admitting: Osteopathic Medicine

## 2019-09-29 DIAGNOSIS — I10 Essential (primary) hypertension: Secondary | ICD-10-CM

## 2019-09-29 MED FILL — AMLODIPINE-BENAZEPRIL 2.5-1: 2.5-10 | 90 days supply | Qty: 90 | Fill #0

## 2019-09-29 MED FILL — ASPIRIN EC 325 MG TABLET: 325 | 90 days supply | Qty: 90 | Fill #0

## 2019-09-29 MED FILL — ATORVASTATIN 40 MG TABLET: 40 | 90 days supply | Qty: 90 | Fill #0

## 2019-10-19 ENCOUNTER — Telehealth: Payer: 59 | Admitting: Emergency Medicine

## 2019-10-19 DIAGNOSIS — J00 Acute nasopharyngitis [common cold]: Secondary | ICD-10-CM | POA: Diagnosis not present

## 2019-10-19 MED ORDER — AMOXICILLIN-POT CLAVULANATE 875-125 MG PO TABS: 1.0000 | ORAL_TABLET | Freq: Two times a day (BID) | ORAL | 0 refills | Status: DC

## 2019-10-19 NOTE — Progress Notes (Signed)
We are sorry that you are not feeling well.  Here is how we plan to help!  Based on what you have shared with me it looks like you have sinusitis.  Sinusitis is inflammation and infection in the sinus cavities of the head.  Based on your presentation I believe you most likely have Acute Viral Sinusitis.This is an infection most likely caused by a virus. There is not specific treatment for viral sinusitis other than to help you with the symptoms until the infection runs its course.  You may use an oral decongestant such as Mucinex D or if you have glaucoma or high blood pressure use plain Mucinex. Saline nasal spray help and can safely be used as often as needed for congestion,   Some authorities believe that zinc sprays or the use of Echinacea may shorten the course of your symptoms.  Generally, antibiotics are not recommended for sinus infections that have lasted less than 7 days.  I will prescribe you Augmentin to take for your sinus infection, but I recommend waiting until your 7th day of illness.  Most times, antibiotics are not needed and you will recover using the flonase and other over-the-counter medications.  Sinus infections are not as easily transmitted as other respiratory infection, however we still recommend that you avoid close contact with loved ones, especially the very young and elderly.  Remember to wash your hands thoroughly throughout the day as this is the number one way to prevent the spread of infection!  Home Care:  Only take medications as instructed by your medical team.  Do not take these medications with alcohol.  A steam or ultrasonic humidifier can help congestion.  You can place a towel over your head and breathe in the steam from hot water coming from a faucet.  Avoid close contacts especially the very young and the elderly.  Cover your mouth when you cough or sneeze.  Always remember to wash your hands.  Get Help Right Away If:  You develop worsening fever  or sinus pain.  You develop a severe head ache or visual changes.  Your symptoms persist after you have completed your treatment plan.  Make sure you  Understand these instructions.  Will watch your condition.  Will get help right away if you are not doing well or get worse.  Your e-visit answers were reviewed by a board certified advanced clinical practitioner to complete your personal care plan.  Depending on the condition, your plan could have included both over the counter or prescription medications.  If there is a problem please reply  once you have received a response from your provider.  Your safety is important to [place].  If you have drug allergies check your prescription carefully.    You can use MyChart to ask questions about today's visit, request a non-urgent call back, or ask for a work or school excuse for 24 hours related to this e-Visit. If it has been greater than 24 hours you will need to follow up with your provider, or enter a new e-Visit to address those concerns.  You will get an e-mail in the next two days asking about your experience.  I hope that your e-visit has been valuable and will speed your recovery. Thank you for using e-visits.  Approximately 5 minutes was used in reviewing the patient's chart, questionnaire, prescribing medications, and documentation.

## 2019-10-26 ENCOUNTER — Other Ambulatory Visit: Payer: Self-pay | Admitting: Osteopathic Medicine

## 2019-10-26 DIAGNOSIS — F3342 Major depressive disorder, recurrent, in full remission: Secondary | ICD-10-CM

## 2019-10-27 MED FILL — PANTOPRAZOLE SOD DR 40 MG T: 40 | 90 days supply | Qty: 180 | Fill #0

## 2019-10-27 MED FILL — ESCITALOPRAM 10 MG TABLET: 10 | 90 days supply | Qty: 90 | Fill #0

## 2019-11-09 DIAGNOSIS — Z1231 Encounter for screening mammogram for malignant neoplasm of breast: Secondary | ICD-10-CM | POA: Diagnosis not present

## 2019-11-09 LAB — HM MAMMOGRAPHY

## 2019-11-19 ENCOUNTER — Encounter: Payer: Self-pay | Admitting: Osteopathic Medicine

## 2019-12-02 DIAGNOSIS — H1045 Other chronic allergic conjunctivitis: Secondary | ICD-10-CM | POA: Diagnosis not present

## 2019-12-02 DIAGNOSIS — H35433 Paving stone degeneration of retina, bilateral: Secondary | ICD-10-CM | POA: Diagnosis not present

## 2019-12-02 DIAGNOSIS — H2513 Age-related nuclear cataract, bilateral: Secondary | ICD-10-CM | POA: Diagnosis not present

## 2019-12-24 DIAGNOSIS — L814 Other melanin hyperpigmentation: Secondary | ICD-10-CM | POA: Diagnosis not present

## 2019-12-24 DIAGNOSIS — L821 Other seborrheic keratosis: Secondary | ICD-10-CM | POA: Diagnosis not present

## 2019-12-24 DIAGNOSIS — D225 Melanocytic nevi of trunk: Secondary | ICD-10-CM | POA: Diagnosis not present

## 2019-12-24 DIAGNOSIS — D1801 Hemangioma of skin and subcutaneous tissue: Secondary | ICD-10-CM | POA: Diagnosis not present

## 2019-12-24 DIAGNOSIS — L579 Skin changes due to chronic exposure to nonionizing radiation, unspecified: Secondary | ICD-10-CM | POA: Diagnosis not present

## 2020-01-03 ENCOUNTER — Other Ambulatory Visit: Payer: Self-pay | Admitting: Osteopathic Medicine

## 2020-01-03 DIAGNOSIS — I10 Essential (primary) hypertension: Secondary | ICD-10-CM

## 2020-01-03 DIAGNOSIS — Z8673 Personal history of transient ischemic attack (TIA), and cerebral infarction without residual deficits: Secondary | ICD-10-CM

## 2020-01-03 MED FILL — ATORVASTATIN 40 MG TABLET: 40 | 90 days supply | Qty: 90 | Fill #0

## 2020-01-03 MED FILL — AMLODIPINE-BENAZEPRIL 2.5-1: 2.5-10 | 90 days supply | Qty: 90 | Fill #1

## 2020-01-03 MED FILL — ASPIRIN EC 325 MG TABLET: 325 | 90 days supply | Qty: 90 | Fill #0

## 2020-01-28 ENCOUNTER — Telehealth: Payer: 59 | Admitting: Emergency Medicine

## 2020-01-28 DIAGNOSIS — J0191 Acute recurrent sinusitis, unspecified: Secondary | ICD-10-CM

## 2020-01-28 NOTE — Progress Notes (Signed)
We are sorry that you are not feeling well.  Here is how we plan to help!  According to the current IDSA guidelines, you do not meet criteria for bacterial infection requiring antibiotic therapy as treatment. Those guidelines are as follows:  1) Persistent symptoms or signs of acute rhinosinusitis lasting 10 days or more without clinical improvement  2) Severe symptoms or signs of high fever (102F [39C] or higher) and purulent nasal discharge or facial pain for three days or more at the beginning of illness  3) Worsening symptoms or signs (i.e., fever, headache, or increase in nasal discharge) that lasted five to six days, got better, and then worsened (double-sickening)  I would recommend over the counter allergy medication like Xyzal or Zyrtec.    Based on what you have shared with me it looks like you have sinusitis.  Sinusitis is inflammation and infection in the sinus cavities of the head.  Based on your presentation I believe you most likely have Acute Viral Sinusitis.This is an infection most likely caused by a virus. There is not specific treatment for viral sinusitis other than to help you with the symptoms until the infection runs its course.  You may use an oral decongestant such as Mucinex D or if you have glaucoma or high blood pressure use plain Mucinex. Saline nasal spray help and can safely be used as often as needed for congestion. Some authorities believe that zinc sprays or the use of Echinacea may shorten the course of your symptoms.  Sinus infections are not as easily transmitted as other respiratory infection, however we still recommend that you avoid close contact with loved ones, especially the very young and elderly.  Remember to wash your hands thoroughly throughout the day as this is the number one way to prevent the spread of infection!  Home Care:  Only take medications as instructed by your medical team.  Do not take these medications with alcohol.  A steam  or ultrasonic humidifier can help congestion.  You can place a towel over your head and breathe in the steam from hot water coming from a faucet.  Avoid close contacts especially the very young and the elderly.  Cover your mouth when you cough or sneeze.  Always remember to wash your hands.  Get Help Right Away If:  You develop worsening fever or sinus pain.  You develop a severe head ache or visual changes.  Your symptoms persist after you have completed your treatment plan.  Make sure you  Understand these instructions.  Will watch your condition.  Will get help right away if you are not doing well or get worse.  Your e-visit answers were reviewed by a board certified advanced clinical practitioner to complete your personal care plan.  Depending on the condition, your plan could have included both over the counter or prescription medications.  If there is a problem please reply  once you have received a response from your provider.  Your safety is important to [place].  If you have drug allergies check your prescription carefully.    You can use MyChart to ask questions about today's visit, request a non-urgent call back, or ask for a work or school excuse for 24 hours related to this e-Visit. If it has been greater than 24 hours you will need to follow up with your provider, or enter a new e-Visit to address those concerns.  You will get an e-mail in the next two days asking about your experience.  I hope that your e-visit has been valuable and will speed your recovery. Thank you for using e-visits.  Greater than 5 but less than 10 minutes spent researching, coordinating, and implementing care for this patient today

## 2020-01-31 ENCOUNTER — Other Ambulatory Visit: Payer: Self-pay | Admitting: Osteopathic Medicine

## 2020-01-31 DIAGNOSIS — F3342 Major depressive disorder, recurrent, in full remission: Secondary | ICD-10-CM

## 2020-01-31 MED FILL — PANTOPRAZOLE SOD DR 40 MG T: 40 | 90 days supply | Qty: 180 | Fill #0

## 2020-01-31 MED FILL — ESCITALOPRAM 10 MG TABLET: 10 | 90 days supply | Qty: 90 | Fill #0

## 2020-02-18 IMAGING — US US PELVIS COMPLETE
1 series · 13 of 25 positions shown · non-contrast
Comparison: None

CLINICAL DATA: Pelvic pain, had endometrial ablation 8 years ago,
no cycles anymore, began having cramping like a menstrual period 2
weeks ago

EXAM:
TRANSABDOMINAL AND TRANSVAGINAL ULTRASOUND OF PELVIS
TECHNIQUE: Both transabdominal and transvaginal ultrasound examinations of the
pelvis were performed. Transabdominal technique was performed for
global imaging of the pelvis including uterus, ovaries, adnexal
regions, and pelvic cul-de-sac. It was necessary to proceed with
endovaginal exam following the transabdominal exam to visualize the
endometrium and ovaries.

[Series 1: us pelvis complete · 0.15mm/px · 13 of 51 slices shown]
[im 1/51]
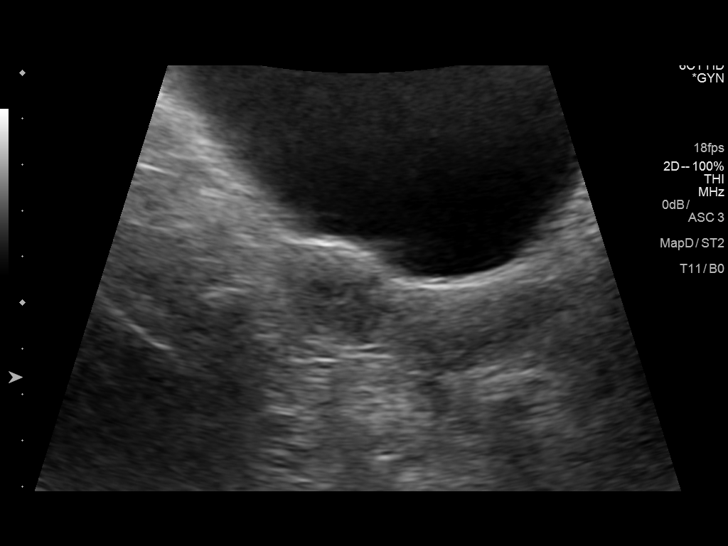
[im 5/51]
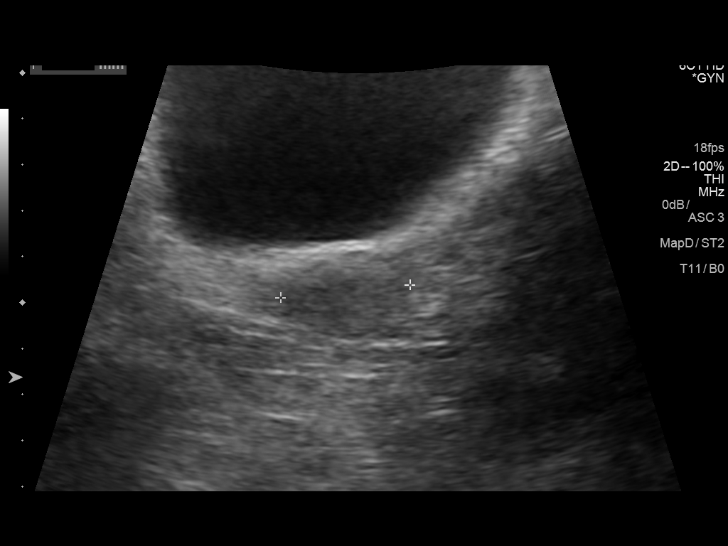
[im 9/51]
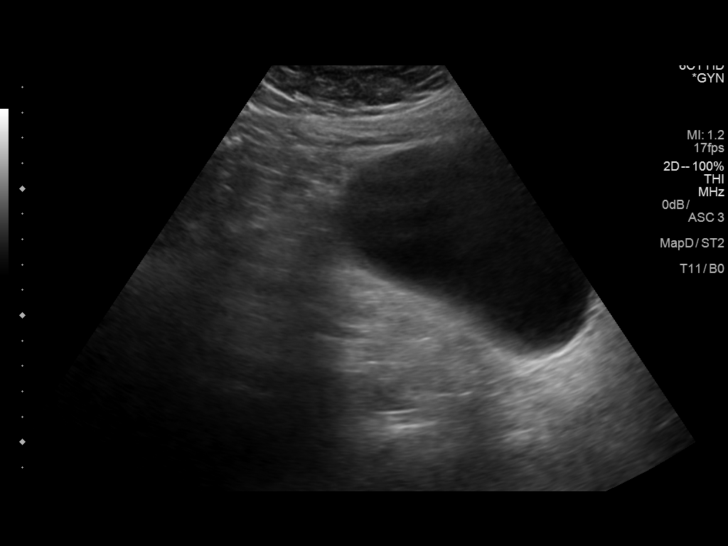
[im 13/51]
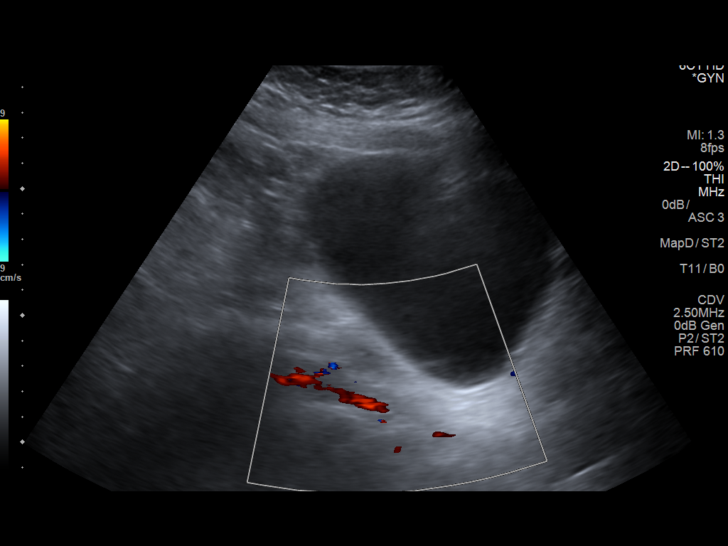
[im 17/51]
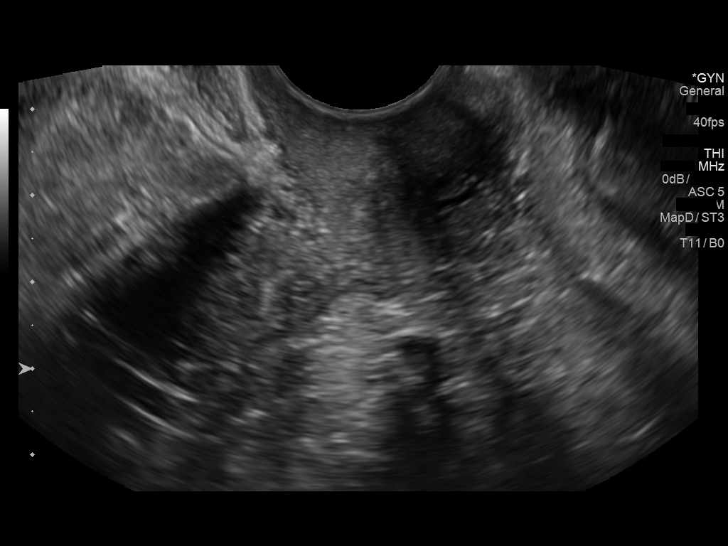
[im 21/51]
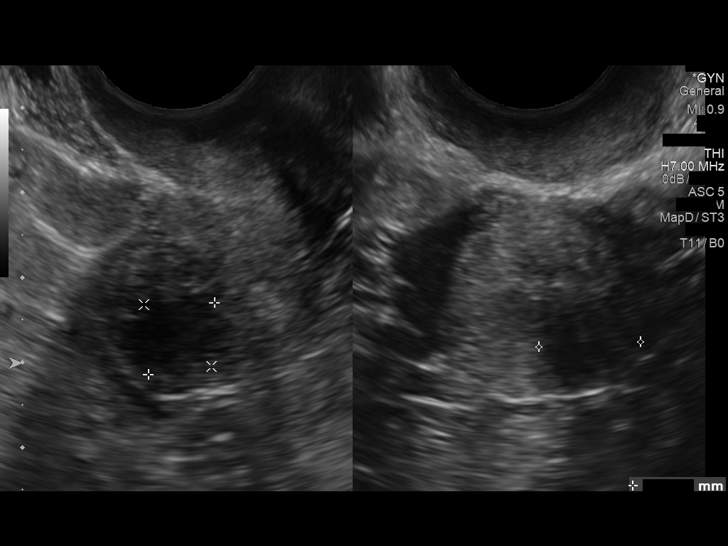
[im 26/51]
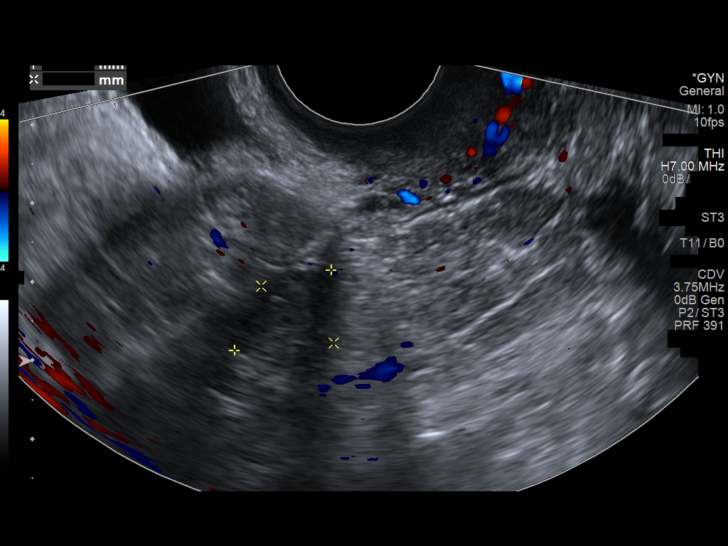
[im 30/51]
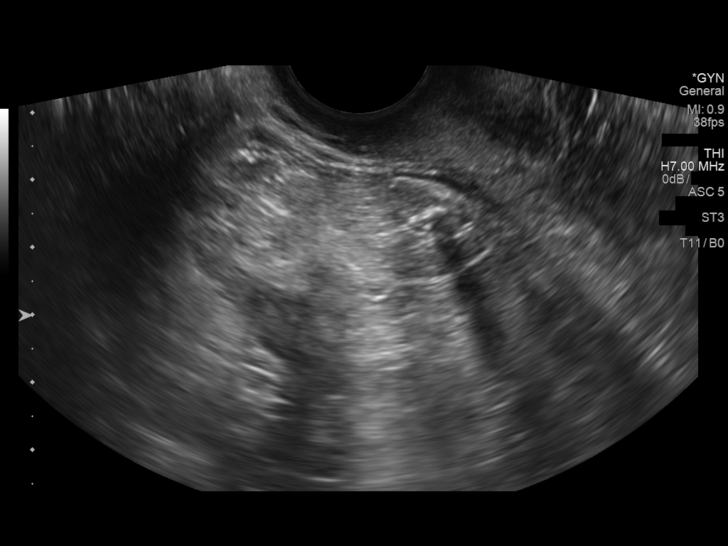
[im 34/51]
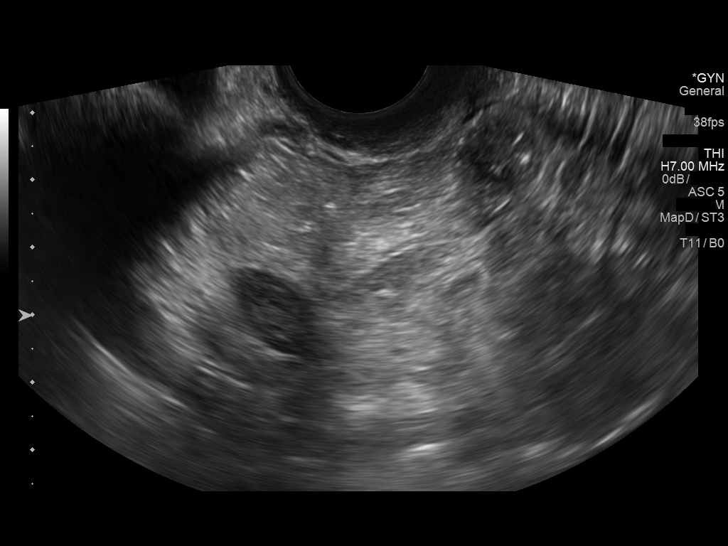
[im 38/51]
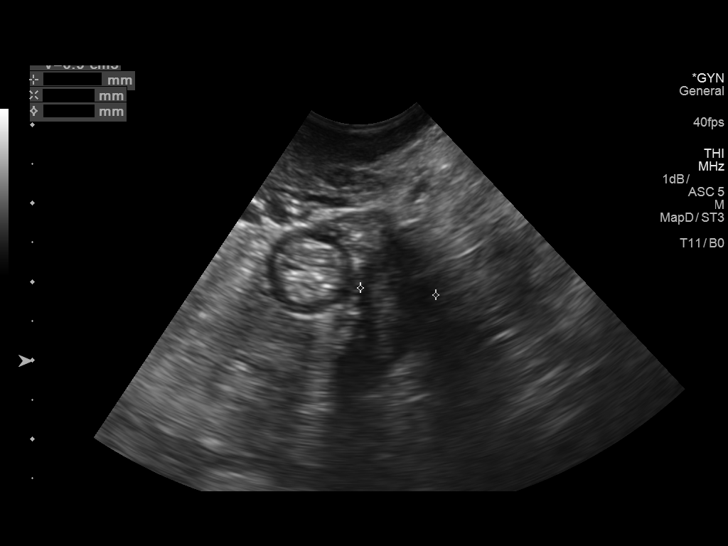
[im 42/51]
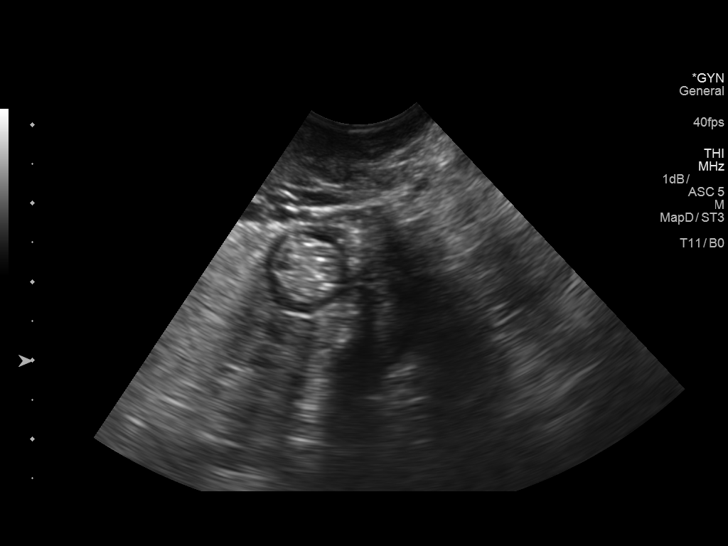
[im 46/51]
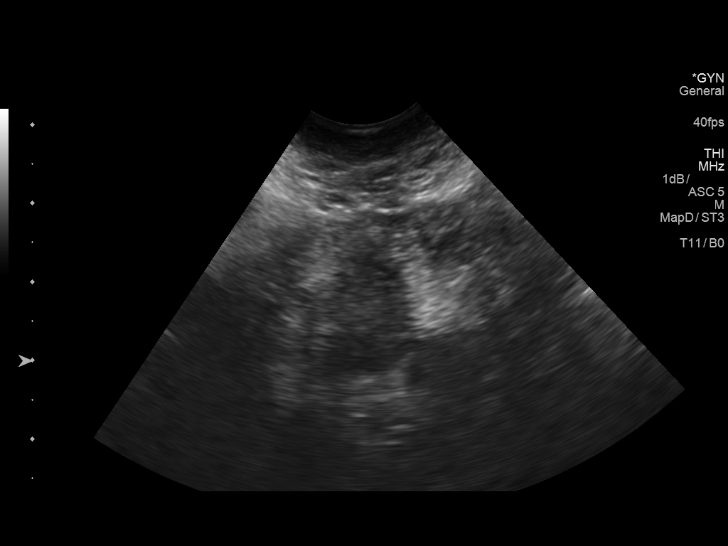
[im 51/51]
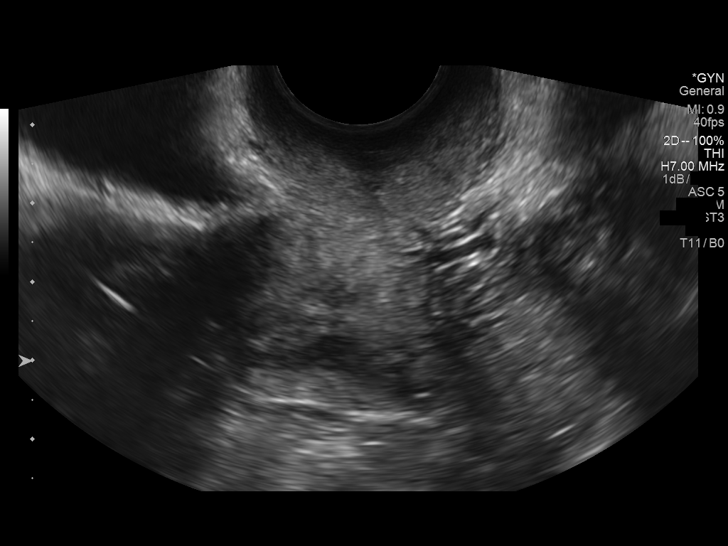

[13 of 25 positions shown; findings below may reference images not displayed]

FINDINGS: Uterus

Measurements: 4.9 x 2.2 x 2.5 cm = volume: 14 mL. Hypoechoic nodule
at upper uterine segment to LEFT, 12 x 11 x 12 mm question small
intramural leiomyoma. Anteverted. Otherwise unremarkable.

Endometrium

Thickness: 3 mm.  No new atrial fluid or focal abnormality

Right ovary

Measurements: 2.3 x 0.9 x 1.2 cm = volume: 1.3 mL. Grossly normal
morphology without mass

Left ovary

Measurements: 4.8 x 1.0 x 1.0 cm = volume: 0.9 mL. Grossly normal
morphology without mass

Other findings

No free pelvic fluid or adnexal masses.
IMPRESSION: Suspect small intramural uterine leiomyoma at upper uterine segment
12 mm greatest size.

Otherwise negative exam.

## 2020-03-13 ENCOUNTER — Telehealth: Payer: 59 | Admitting: Nurse Practitioner

## 2020-03-13 DIAGNOSIS — L247 Irritant contact dermatitis due to plants, except food: Secondary | ICD-10-CM

## 2020-03-13 MED ORDER — PREDNISONE 10 MG (21) PO TBPK: ORAL_TABLET | ORAL | 0 refills | Status: DC

## 2020-03-13 MED FILL — predniSONE 10 MG TABS: 10 | 6 days supply | Qty: 21 | Fill #0

## 2020-03-13 NOTE — Progress Notes (Signed)

## 2020-03-20 ENCOUNTER — Telehealth: Payer: 59 | Admitting: Emergency Medicine

## 2020-03-20 ENCOUNTER — Ambulatory Visit (INDEPENDENT_AMBULATORY_CARE_PROVIDER_SITE_OTHER): Payer: 59 | Admitting: Nurse Practitioner

## 2020-03-20 ENCOUNTER — Encounter: Payer: Self-pay | Admitting: Nurse Practitioner

## 2020-03-20 VITALS — BP 122/83 | HR 75 | Temp 98.3°F | Ht 66.0 in | Wt 212.1 lb

## 2020-03-20 DIAGNOSIS — L237 Allergic contact dermatitis due to plants, except food: Secondary | ICD-10-CM

## 2020-03-20 DIAGNOSIS — L247 Irritant contact dermatitis due to plants, except food: Secondary | ICD-10-CM

## 2020-03-20 MED ORDER — PREDNISONE 20 MG PO TABS: ORAL_TABLET | ORAL | 0 refills | Status: DC

## 2020-03-20 MED ORDER — BETAMETHASONE DIPROPIONATE 0.05 % EX CREA: TOPICAL_CREAM | Freq: Two times a day (BID) | CUTANEOUS | 1 refills | Status: DC

## 2020-03-20 MED ORDER — DEXAMETHASONE SODIUM PHOSPHATE 10 MG/ML IJ SOLN
10.0000 mg | Freq: Once | INTRAMUSCULAR | Status: AC
  Administered 2020-03-20: 10 mg via INTRAMUSCULAR

## 2020-03-20 MED ORDER — HYDROXYZINE HCL 50 MG PO TABS: 50.0000 mg | ORAL_TABLET | Freq: Every evening | ORAL | 3 refills | Status: DC | PRN

## 2020-03-20 MED ORDER — PREDNISONE 20 MG PO TABS: ORAL_TABLET | ORAL | 0 refills | Status: AC

## 2020-03-20 MED FILL — BETAMETHASONE DP 0.05% CRM: 0.05 | 14 days supply | Qty: 45 | Fill #0

## 2020-03-20 MED FILL — predniSONE 20 MG TABS: 20 | 12 days supply | Qty: 14 | Fill #0

## 2020-03-20 MED FILL — hydrOXYzine HCL 50 MG TABS: 50 | 15 days supply | Qty: 30 | Fill #0

## 2020-03-20 NOTE — Progress Notes (Signed)

## 2020-03-20 NOTE — Progress Notes (Signed)
Acute Office Visit  Subjective:    Patient ID: Margaret Stanton, female    DOB: 1961-07-04, 59 y.o.   MRN: [id number]  Chief Complaint  Patient presents with  . Rash    Onset:10d, working in yard, believes she got into some sumac, blisters on elbows to wrist on bilateral arms, is now on legs and trunk, did a round a prednisone with very minimal relief, finished prednisone 2 days ago    HPI Patient is in today for allergic dermatitis due to poison sumac exposure approximately 10 days ago. She did have a virtual visit last week and was prescribed 5 days of prednisone, but this has not been helpful with symptoms and new areas of blisters and redness have continued to form. She also had an older prescription of steroid cream that she tried, without relief. She reports areas of redness, itching, and blistering bilaterally on her wrists and arms, her thighs, her abdomen, and under her breasts.   Past Medical History:  Diagnosis Date  . CVA (cerebral infarction)    2013  . Hypertension   . Uterine fibromyoma     Past Surgical History:  Procedure Laterality Date  . ABLATION  01-2012   uterine  . CHOLECYSTECTOMY    . CHOLECYSTECTOMY, LAPAROSCOPIC  07-2006    Family History  Problem Relation Age of Onset  . Heart attack Father   . Diabetes Mother   . Hypertension Unknown        parents  . Colon cancer Neg Hx   . Pancreatic cancer Neg Hx   . Rectal cancer Neg Hx   . Stomach cancer Neg Hx     Social History   Socioeconomic History  . Marital status: Married    Spouse name: Not on file  . Number of children: Not on file  . Years of education: Not on file  . Highest education level: Not on file  Occupational History  . Not on file  Tobacco Use  . Smoking status: Never Smoker  . Smokeless tobacco: Never Used  Substance and Sexual Activity  . Alcohol use: Yes    Comment: 1 glass of wine about every 2 months  . Drug use: No  . Sexual activity: Yes    Partners: Male  Other  Topics Concern  . Not on file  Social History Narrative  . Not on file   Social Determinants of Health   Financial Resource Strain:   . Difficulty of Paying Living Expenses:   Food Insecurity:   . Worried About Charity fundraiser in the Last Year:   . Arboriculturist in the Last Year:   Transportation Needs:   . Film/video editor (Medical):   Marland Kitchen Lack of Transportation (Non-Medical):   Physical Activity:   . Days of Exercise per Week:   . Minutes of Exercise per Session:   Stress:   . Feeling of Stress :   Social Connections:   . Frequency of Communication with Friends and Family:   . Frequency of Social Gatherings with Friends and Family:   . Attends Religious Services:   . Active Member of Clubs or Organizations:   . Attends Archivist Meetings:   Marland Kitchen Marital Status:   Intimate Partner Violence:   . Fear of Current or Ex-Partner:   . Emotionally Abused:   Marland Kitchen Physically Abused:   . Sexually Abused:     Outpatient Medications Prior to Visit  Medication Sig Dispense Refill  .  ALPRAZolam (XANAX) 0.5 MG tablet Take 0.5-1 tablets (0.25-0.5 mg total) by mouth 2 (two) times daily as needed for anxiety. 30 tablet 0  . amlodipine-benazepril (LOTREL) 2.5-10 MG capsule TAKE 1 CAPSULE BY MOUTH DAILY. 90 capsule 1  . aspirin EC 325 MG tablet TAKE 1 TABLET (325 MG TOTAL) BY MOUTH DAILY. 90 tablet 1  . atorvastatin (LIPITOR) 40 MG tablet TAKE 1 TABLET (40 MG TOTAL) BY MOUTH DAILY. 90 tablet 1  . escitalopram (LEXAPRO) 10 MG tablet TAKE 1 TABLET BY MOUTH DAILY 90 tablet 1  . pantoprazole (PROTONIX) 40 MG tablet TAKE 1 TABLET BY MOUTH TWICE DAILY 180 tablet 1  . amoxicillin-clavulanate (AUGMENTIN) 875-125 MG tablet Take 1 tablet by mouth every 12 (twelve) hours. (Patient not taking: Reported on 03/20/2020) 14 tablet 0  . predniSONE (DELTASONE) 20 MG tablet 3 tabs po daily x 3 days, then 2 tabs x 3 days, then 1.5 tabs x 3 days, then 1 tab x 3 days, then 0.5 tabs x 3 days (Patient  not taking: Reported on 03/20/2020) 27 tablet 0   No facility-administered medications prior to visit.    No Known Allergies  Review of Systems  Constitutional: Negative for appetite change, chills, fatigue and fever.  HENT: Negative for mouth sores, sore throat and trouble swallowing.   Eyes: Negative for pain, discharge and itching.  Respiratory: Negative for cough, chest tightness and shortness of breath.   Cardiovascular: Negative for chest pain and palpitations.  Skin: Positive for rash.  Allergic/Immunologic: Positive for environmental allergies.  Neurological: Negative for dizziness and weakness.       Objective:    Physical Exam Vitals and nursing note reviewed.  Constitutional:      Appearance: Normal appearance.  HENT:     Head: Normocephalic.  Eyes:     Extraocular Movements: Extraocular movements intact.     Conjunctiva/sclera: Conjunctivae normal.     Pupils: Pupils are equal, round, and reactive to light.  Cardiovascular:     Rate and Rhythm: Normal rate.  Pulmonary:     Effort: Pulmonary effort is normal.  Musculoskeletal:        General: Normal range of motion.     Cervical back: Normal range of motion.  Skin:    General: Skin is warm and dry.     Capillary Refill: Capillary refill takes less than 2 seconds.     Findings: Erythema, lesion and rash present.     Comments: Intact yellow appearing blisters on an erythematous base present bilaterally at the antecubital fossa, the forearms, the wrists, and the back of the hands. Erythematous areas with intact flesh colored blisters across the abdomen and under the left breast and across the anterior thighs bilaterally.    Neurological:     General: No focal deficit present.     Mental Status: She is alert and oriented to person, place, and time.  Psychiatric:        Mood and Affect: Mood normal.        Behavior: Behavior normal.        Thought Content: Thought content normal.        Judgment: Judgment  normal.     BP 122/83   Pulse 75   Temp 98.3 F (36.8 C) (Oral)   Ht 5\' 6"  (1.676 m)   Wt 212 lb 1.6 oz (96.2 kg)   SpO2 99%   BMI 34.23 kg/m  Wt Readings from Last 3 Encounters:  03/20/20 212 lb 1.6 oz (96.2 kg)  06/30/19 210 lb 11.2 oz (95.6 kg)  03/10/19 210 lb (95.3 kg)    There are no preventive care reminders to display for this patient.  There are no preventive care reminders to display for this patient.   Lab Results  Component Value Date   TSH 2.37 05/13/2017   Lab Results  Component Value Date   WBC 6.1 06/30/2019   HGB 10.0 (L) 06/30/2019   HCT 29.8 (L) 06/30/2019   MCV 89.2 06/30/2019   PLT 231 06/30/2019   Lab Results  Component Value Date   NA 141 06/30/2019   K 3.7 06/30/2019   CO2 31 06/30/2019   GLUCOSE 96 06/30/2019   BUN 15 06/30/2019   CREATININE 0.83 06/30/2019   BILITOT 0.5 06/30/2019   ALKPHOS 94 05/13/2017   AST 19 06/30/2019   ALT 17 06/30/2019   PROT 6.8 06/30/2019   ALBUMIN 4.4 05/13/2017   CALCIUM 8.9 06/30/2019   Lab Results  Component Value Date   CHOL 155 06/30/2019   Lab Results  Component Value Date   HDL 54 06/30/2019   Lab Results  Component Value Date   LDLCALC 87 06/30/2019   Lab Results  Component Value Date   TRIG 56 06/30/2019   Lab Results  Component Value Date   CHOLHDL 2.9 06/30/2019   No results found for: HGBA1C     Assessment & Plan:  1. Irritant contact dermatitis due to plants, except food Symptoms and presentation consistent with continued eruption of contact dermatitis brought on by exposure to poison sumac plant.  Instructions to wash clothing and shoes worn in the yard in hot water and tumble dry on high heat. Wash linens and towels used after exposure the same. It is unlikely oils are still remaining on the skin, but she may consider washing in body wash designed for poison sumac exposure to remove any residual oils from the skin.  Plan to treat with 10mg  IM dexamethasone injection  today followed by 12 day prednisone taper starting tomorrow. She may use hydroxyzine 50mg  PO nightly to help with itching and sleep. She may use betamethasone cream 0.5% BID to the affected areas to help with itching and redness.  Patient instructed to monitor for signs of infection from blistered areas and to notify the office if this occurs.  Follow-up if symptoms worsen or fail to improve.   - hydrOXYzine (ATARAX/VISTARIL) 50 MG tablet; Take 1 tablet (50 mg total) by mouth at bedtime and may repeat dose one time if needed. For itching.  Dispense: 30 tablet; Refill: 3 - betamethasone dipropionate 0.05 % cream; Apply topically 2 (two) times daily. To affected area(s) as needed for itching and contact dermatitis.  Dispense: 45 g; Refill: 1 - dexamethasone (DECADRON) injection 10 mg - predniSONE (DELTASONE) 20 MG tablet; Take 2 tablets (40 mg total) by mouth daily with breakfast for 4 days, THEN 1 tablet (20 mg total) daily with breakfast for 4 days, THEN 0.5 tablets (10 mg total) daily with breakfast for 4 days.  Dispense: 14 tablet; Refill: 0   Orma Render, NP

## 2020-03-20 NOTE — Patient Instructions (Signed)
Contact Dermatitis Dermatitis is redness, soreness, and swelling (inflammation) of the skin. Contact dermatitis is a reaction to certain substances that touch the skin. Many different substances can cause contact dermatitis. There are two types of contact dermatitis:  Irritant contact dermatitis. This type is caused by something that irritates your skin, such as having dry hands from washing them too often with soap. This type does not require previous exposure to the substance for a reaction to occur. This is the most common type.  Allergic contact dermatitis. This type is caused by a substance that you are allergic to, such as poison ivy. This type occurs when you have been exposed to the substance (allergen) and develop a sensitivity to it. Dermatitis may develop soon after your first exposure to the allergen, or it may not develop until the next time you are exposed and every time thereafter. What are the causes? Irritant contact dermatitis is most commonly caused by exposure to:  Makeup.  Soaps.  Detergents.  Bleaches.  Acids.  Metal salts, such as nickel. Allergic contact dermatitis is most commonly caused by exposure to:  Poisonous plants.  Chemicals.  Jewelry.  Latex.  Medicines.  Preservatives in products, such as clothing. What increases the risk? You are more likely to develop this condition if you have:  A job that exposes you to irritants or allergens.  Certain medical conditions, such as asthma or eczema. What are the signs or symptoms? Symptoms of this condition may occur on your body anywhere the irritant has touched you or is touched by you.  Symptoms include: ? Dryness or flaking. ? Redness. ? Cracks. ? Itching. ? Pain or a burning feeling. ? Blisters. ? Drainage of small amounts of blood or clear fluid from skin cracks. With allergic contact dermatitis, there may also be swelling in areas such as the eyelids, mouth, or genitals. How is this  diagnosed? This condition is diagnosed with a medical history and physical exam.  A patch skin test may be performed to help determine the cause.  If the condition is related to your job, you may need to see an occupational medicine specialist. How is this treated? This condition is treated by checking for the cause of the reaction and protecting your skin from further contact. Treatment may also include:  Steroid creams or ointments. Oral steroid medicines may be needed in more severe cases.  Antibiotic medicines or antibacterial ointments, if a skin infection is present.  Antihistamine lotion or an antihistamine taken by mouth to ease itching.  A bandage (dressing). Follow these instructions at home: Skin care  Moisturize your skin as needed.  Apply cool compresses to the affected areas.  Try applying baking soda paste to your skin. Stir water into baking soda until it reaches a paste-like consistency.  Do not scratch your skin, and avoid friction to the affected area.  Avoid the use of soaps, perfumes, and dyes. Medicines  Take or apply over-the-counter and prescription medicines only as told by your health care provider.  If you were prescribed an antibiotic medicine, take or apply the antibiotic as told by your health care provider. Do not stop using the antibiotic even if your condition improves. Bathing  Try taking a bath with: ? Epsom salts. Follow the instructions on the packaging. You can get these at your local pharmacy or grocery store. ? Baking soda. Pour a small amount into the bath as directed by your health care provider. ? Colloidal oatmeal. Follow the instructions on the   packaging. You can get this at your local pharmacy or grocery store.  Bathe less frequently, such as every other day.  Bathe in lukewarm water. Avoid using hot water. Bandage care  If you were given a bandage (dressing), change it as told by your health care provider.  Wash your hands  with soap and water before and after you change your dressing. If soap and water are not available, use hand sanitizer. General instructions  Avoid the substance that caused your reaction. If you do not know what caused it, keep a journal to try to track what caused it. Write down: ? What you eat. ? What cosmetic products you use. ? What you drink. ? What you wear in the affected area. This includes jewelry.  Check the affected areas every day for signs of infection. Check for: ? More redness, swelling, or pain. ? More fluid or blood. ? Warmth. ? Pus or a bad smell.  Keep all follow-up visits as told by your health care provider. This is important. Contact a health care provider if:  Your condition does not improve with treatment.  Your condition gets worse.  You have signs of infection such as swelling, tenderness, redness, soreness, or warmth in the affected area.  You have a fever.  You have new symptoms. Get help right away if:  You have a severe headache, neck pain, or neck stiffness.  You vomit.  You feel very sleepy.  You notice red streaks coming from the affected area.  Your bone or joint underneath the affected area becomes painful after the skin has healed.  The affected area turns darker.  You have difficulty breathing. Summary  Dermatitis is redness, soreness, and swelling (inflammation) of the skin. Contact dermatitis is a reaction to certain substances that touch the skin.  Symptoms of this condition may occur on your body anywhere the irritant has touched you or is touched by you.  This condition is treated by figuring out what caused the reaction and protecting your skin from further contact. Treatment may also include medicines and skin care.  Avoid the substance that caused your reaction. If you do not know what caused it, keep a journal to try to track what caused it.  Contact a health care provider if your condition gets worse or you have signs  of infection such as swelling, tenderness, redness, soreness, or warmth in the affected area. This information is not intended to replace advice given to you by your health care provider. Make sure you discuss any questions you have with your health care provider. Document Revised: 02/03/2019 Document Reviewed: 04/29/2018 [employer] Patient Education  2020 [employer]  

## 2020-04-04 ENCOUNTER — Other Ambulatory Visit: Payer: Self-pay | Admitting: Osteopathic Medicine

## 2020-04-04 DIAGNOSIS — I10 Essential (primary) hypertension: Secondary | ICD-10-CM

## 2020-04-27 MED FILL — ESCITALOPRAM 10 MG TABLET: 10 | 90 days supply | Qty: 90 | Fill #1

## 2020-04-27 MED FILL — PANTOPRAZOLE SOD DR 40 MG T: 40 | 90 days supply | Qty: 180 | Fill #1

## 2020-06-21 ENCOUNTER — Telehealth: Payer: Self-pay

## 2020-06-21 ENCOUNTER — Other Ambulatory Visit: Payer: Self-pay

## 2020-06-21 DIAGNOSIS — I10 Essential (primary) hypertension: Secondary | ICD-10-CM

## 2020-06-21 DIAGNOSIS — E785 Hyperlipidemia, unspecified: Secondary | ICD-10-CM

## 2020-06-21 DIAGNOSIS — Z0000 Encounter for general adult medical examination without abnormal findings: Secondary | ICD-10-CM

## 2020-06-21 MED ORDER — AMLODIPINE BESY-BENAZEPRIL HCL 2.5-10 MG PO CAPS: 1.0000 | ORAL_CAPSULE | Freq: Every day | ORAL | 0 refills | Status: DC

## 2020-06-21 MED FILL — AMLODIPINE-BENAZEPRIL 2.5-1: 2.5-10 | 90 days supply | Qty: 90 | Fill #0

## 2020-06-21 NOTE — Telephone Encounter (Signed)
Patient has upcoming annual visit coming up 07/18/2020. Wants to know if she have her blood work orders put in ahead of time before the visit. Please advise

## 2020-06-27 ENCOUNTER — Encounter: Payer: Self-pay | Admitting: Osteopathic Medicine

## 2020-06-27 NOTE — Telephone Encounter (Signed)
mychart message sent

## 2020-07-11 MED FILL — AMLODIPINE-BENAZEPRIL 2.5-1: 2.5-10 | 90 days supply | Qty: 90 | Fill #0

## 2020-07-13 DIAGNOSIS — I10 Essential (primary) hypertension: Secondary | ICD-10-CM | POA: Diagnosis not present

## 2020-07-13 DIAGNOSIS — E785 Hyperlipidemia, unspecified: Secondary | ICD-10-CM | POA: Diagnosis not present

## 2020-07-13 DIAGNOSIS — Z0000 Encounter for general adult medical examination without abnormal findings: Secondary | ICD-10-CM | POA: Diagnosis not present

## 2020-07-13 LAB — COMPLETE METABOLIC PANEL WITH GFR
AG Ratio: 1.8 (calc) (ref 1.0–2.5)
ALT: 21 U/L (ref 6–29)
AST: 18 U/L (ref 10–35)
Albumin: 4.5 g/dL (ref 3.6–5.1)
Alkaline phosphatase (APISO): 119 U/L (ref 37–153)
BUN: 18 mg/dL (ref 7–25)
CO2: 33 mmol/L — ABNORMAL HIGH (ref 20–32)
Calcium: 9.1 mg/dL (ref 8.6–10.4)
Chloride: 102 mmol/L (ref 98–110)
Creat: 0.79 mg/dL (ref 0.50–1.05)
GFR, Est African American: 96 mL/min/{1.73_m2} (ref >=60)
GFR, Est Non African American: 83 mL/min/{1.73_m2} (ref >=60)
Globulin: 2.5 g/dL (calc) (ref 1.9–3.7)
Glucose, Bld: 89 mg/dL (ref 65–99)
Potassium: 3.5 mmol/L (ref 3.5–5.3)
Sodium: 141 mmol/L (ref 135–146)
Total Bilirubin: 0.5 mg/dL (ref 0.2–1.2)
Total Protein: 7 g/dL (ref 6.1–8.1)

## 2020-07-13 LAB — CBC
HCT: 31.6 % — ABNORMAL LOW (ref 35.0–45.0)
Hemoglobin: 10.7 g/dL — ABNORMAL LOW (ref 11.7–15.5)
MCH: 30.9 pg (ref 27.0–33.0)
MCHC: 33.9 g/dL (ref 32.0–36.0)
MCV: 91.3 fL (ref 80.0–100.0)
MPV: 10.8 fL (ref 7.5–12.5)
Platelets: 242 10*3/uL (ref 140–400)
RBC: 3.46 10*6/uL — ABNORMAL LOW (ref 3.80–5.10)
RDW: 12.5 % (ref 11.0–15.0)
WBC: 6.1 10*3/uL (ref 3.8–10.8)

## 2020-07-13 LAB — LIPID PANEL
Cholesterol: 155 mg/dL (ref <200)
HDL: 54 mg/dL (ref >=50)
LDL Cholesterol (Calc): 85 mg/dL (calc)
Non-HDL Cholesterol (Calc): 101 mg/dL (calc) (ref <130)
Total CHOL/HDL Ratio: 2.9 (calc) (ref <5.0)
Triglycerides: 70 mg/dL (ref <150)

## 2020-07-18 ENCOUNTER — Encounter: Payer: 59 | Admitting: Osteopathic Medicine

## 2020-07-23 ENCOUNTER — Encounter: Payer: Self-pay | Admitting: Nurse Practitioner

## 2020-07-23 NOTE — Progress Notes (Signed)
BP 127/81   Pulse 77   Temp 98.3 F (36.8 C) (Oral)   Ht 5\' 5"  (1.651 m)   Wt 208 lb 6.4 oz (94.5 kg)   SpO2 100%   BMI 34.68 kg/m    Subjective:    Patient ID: Margaret Stanton, female    DOB: Dec 11, 1960, 59 y.o.   MRN: [id number]  HPI: Margaret Stanton is a 59 y.o. female presenting on 07/24/2020 for comprehensive medical examination. Current medical history includes: has Stricture and stenosis of esophagus; Essential hypertension, benign; Hyperlipidemia LDL goal <100; History of CVA (cerebrovascular accident) without residual deficits; Depression; GERD (gastroesophageal reflux disease); Migraines; Adenoma of left adrenal gland; Common bile duct (CBD) obstruction; Anemia; and Encounter for annual physical exam on their problem list.  She currently takes escitalopram 10mg  and alprazolam 0.25-0.5mg  up to twice a day as needed for anxiety.  She currently takes amlodipine-benazepril 2.5/10mg  daily for hypertension She currently takes atrovastatin 40mg  for hyperlipidemia She currently takes pantoprazole 40mg  once to twice daily for GERD.  She currently lives with: her husband Interim Problems from her last visit: no - she has been dealing with the loss of her mother last October, but it improving emotionally. She reports she does still have triggers which cause tearful moments, but overall she is doing much better. She does still take intermittent xanax (0.25) on rare occasion when her anxiety is much worse, but she is coping well overall.   She reports regular vision exams q1-5y: yes- annually in the spring She reports regular dental exams q 20m: yes Her diet consists of: mixture of home cooked and fast food meals.  She endorses exercise and/or activity of: Tredmill at home- has not been consistent recently, but working on getting back to 30 minutes of walking daily.  She works at: [employer]  She endorses ETOH use of very rarely on social occassions She denies nictoine use She denies  illegal substance use    She reports no menstrual periods after ablation in 2017 and labs now report post-menopausal.  Current menopausal symptoms: no She is currently sexually active with 1 (spouse) partners. She denies concerns today about STI Contraception choices are: post-menopausal  She denies concerns about skin changes today: Annual dermatology exam in January She denies concerns about bowel changes today She denies concerns about bladder changes today  Depression Screen done today and results listed below:  Depression screen [facility] 2/9 07/24/2020 06/30/2019 03/10/2019 03/02/2019 11/17/2018  Decreased Interest 1 1 0 1 0  Down, Depressed, Hopeless 1 1 1  0 0  PHQ - 2 Score 2 2 1 1  0  Altered sleeping 0 0 - - 0  Tired, decreased energy 0 0 - - 0  Change in appetite 0 1 - - 1  Feeling bad or failure about yourself  0 0 - - 0  Trouble concentrating 1 0 - - 0  Moving slowly or fidgety/restless 0 0 - - 0  Suicidal thoughts 0 0 - - 0  PHQ-9 Score 3 3 - - 1  Difficult doing work/chores Not difficult at all Not difficult at all - - Not difficult at all    She does not have a history of falls. I did not complete a risk assessment for falls. A plan of care for falls was not documented.   Past Medical History:  Past Medical History:  Diagnosis Date  . Anxiety   . Contusion of rib on left side 08/17/2015  . CVA (cerebral infarction)  2013  . GERD (gastroesophageal reflux disease)   . Hyperlipidemia   . Hypertension   . Uterine fibromyoma     Surgical History:  Past Surgical History:  Procedure Laterality Date  . ABLATION  01-2012   uterine  . CHOLECYSTECTOMY    . CHOLECYSTECTOMY, LAPAROSCOPIC  07-2006    Medications:  No current outpatient medications on file prior to visit.   No current facility-administered medications on file prior to visit.    Allergies:  No Known Allergies  Social History:  Social History   Socioeconomic History  . Marital status: Married     Spouse name: Not on file  . Number of children: Not on file  . Years of education: Not on file  . Highest education level: Not on file  Occupational History  . Not on file  Tobacco Use  . Smoking status: Never Smoker  . Smokeless tobacco: Never Used  Vaping Use  . Vaping Use: Never used  Substance and Sexual Activity  . Alcohol use: Yes    Comment: Rarely  . Drug use: No  . Sexual activity: Yes    Partners: Male    Birth control/protection: Post-menopausal  Other Topics Concern  . Not on file  Social History Narrative  . Not on file   Social Determinants of Health   Financial Resource Strain:   . Difficulty of Paying Living Expenses: Not on file  Food Insecurity:   . Worried About Charity fundraiser in the Last Year: Not on file  . Ran Out of Food in the Last Year: Not on file  Transportation Needs:   . Lack of Transportation (Medical): Not on file  . Lack of Transportation (Non-Medical): Not on file  Physical Activity:   . Days of Exercise per Week: Not on file  . Minutes of Exercise per Session: Not on file  Stress:   . Feeling of Stress : Not on file  Social Connections:   . Frequency of Communication with Friends and Family: Not on file  . Frequency of Social Gatherings with Friends and Family: Not on file  . Attends Religious Services: Not on file  . Active Member of Clubs or Organizations: Not on file  . Attends Archivist Meetings: Not on file  . Marital Status: Not on file  Intimate Partner Violence:   . Fear of Current or Ex-Partner: Not on file  . Emotionally Abused: Not on file  . Physically Abused: Not on file  . Sexually Abused: Not on file   Social History   Tobacco Use  Smoking Status Never Smoker  Smokeless Tobacco Never Used   Social History   Substance and Sexual Activity  Alcohol Use Yes   Comment: Rarely    Family History:  Family History  Problem Relation Age of Onset  . Heart attack Father   . Diabetes Mother   .  Hypertension Other        parents  . Colon cancer Neg Hx   . Pancreatic cancer Neg Hx   . Rectal cancer Neg Hx   . Stomach cancer Neg Hx     Past medical history, surgical history, medications, allergies, family history and social history reviewed with patient today and changes made to appropriate areas of the chart.   All ROS negative except what is listed above and in the HPI.      Objective:    BP 127/81   Pulse 77   Temp 98.3 F (36.8 C) (Oral)  Ht 5\' 5"  (1.651 m)   Wt 208 lb 6.4 oz (94.5 kg)   SpO2 100%   BMI 34.68 kg/m   Wt Readings from Last 3 Encounters:  07/24/20 208 lb 6.4 oz (94.5 kg)  03/20/20 212 lb 1.6 oz (96.2 kg)  06/30/19 210 lb 11.2 oz (95.6 kg)    Physical Exam Exam conducted with a chaperone present.  Constitutional:      General: She is not in acute distress.    Appearance: Normal appearance. She is normal weight.  HENT:     Head: Normocephalic and atraumatic.     Right Ear: Hearing, tympanic membrane, ear canal and external ear normal.     Left Ear: Hearing, tympanic membrane, ear canal and external ear normal.     Nose: Nose normal. No mucosal edema, congestion or rhinorrhea.     Right Sinus: No maxillary sinus tenderness or frontal sinus tenderness.     Left Sinus: No maxillary sinus tenderness or frontal sinus tenderness.     Mouth/Throat:     Lips: Pink.     Mouth: Mucous membranes are moist.     Tongue: No lesions.     Pharynx: Oropharynx is clear. Uvula midline. No oropharyngeal exudate or posterior oropharyngeal erythema.     Tonsils: No tonsillar exudate or tonsillar abscesses.  Eyes:     General: Lids are normal. Vision grossly intact.     Extraocular Movements: Extraocular movements intact.     Conjunctiva/sclera: Conjunctivae normal.     Pupils: Pupils are equal, round, and reactive to light.  Neck:     Thyroid: No thyroid mass, thyromegaly or thyroid tenderness.     Vascular: No carotid bruit.     Trachea: Trachea normal.   Cardiovascular:     Rate and Rhythm: Normal rate and regular rhythm.     Chest Wall: PMI is not displaced.     Pulses: Normal pulses.     Heart sounds: Normal heart sounds. No murmur heard.   Pulmonary:     Effort: Pulmonary effort is normal. No respiratory distress.     Breath sounds: Normal breath sounds. No rhonchi.  Chest:     Chest wall: No mass, deformity or tenderness.     Breasts: Breasts are symmetrical.        Right: Normal. No mass, skin change or tenderness.        Left: Normal. No mass, skin change or tenderness.  Abdominal:     General: Abdomen is flat. Bowel sounds are normal. There is no distension.     Palpations: Abdomen is soft. There is no hepatomegaly, splenomegaly or mass.     Tenderness: There is no abdominal tenderness. There is no right CVA tenderness, left CVA tenderness, guarding or rebound.     Hernia: No hernia is present.  Genitourinary:    Exam position: Lithotomy position.     Pubic Area: No rash or pubic lice.      Labia:        Right: No tenderness, lesion or injury.        Left: No tenderness, lesion or injury.      Urethra: No prolapse or urethral swelling.     Vagina: Normal. No tenderness or lesions.     Cervix: No cervical motion tenderness, discharge, friability or lesion.     Uterus: Normal. Not tender.      Adnexa: Right adnexa normal and left adnexa normal.       Right: No mass or tenderness.  Left: No mass or tenderness.    Musculoskeletal:        General: No swelling, tenderness or signs of injury. Normal range of motion.     Right shoulder: Normal.     Left shoulder: Normal.     Right upper arm: Normal.     Left upper arm: Normal.     Right elbow: Normal.     Left elbow: Normal.     Right forearm: Normal.     Left forearm: Normal.     Right wrist: Normal.     Left wrist: Normal.     Right hand: Normal.     Left hand: Normal.     Cervical back: Normal, full passive range of motion without pain and normal range of  motion.     Thoracic back: Normal.     Lumbar back: Normal.     Right hip: Normal.     Left hip: Normal.     Right upper leg: Normal.     Left upper leg: Normal.     Right knee: Normal.     Left knee: Normal.     Right lower leg: Normal. No edema.     Left lower leg: Normal. No edema.     Right ankle: Normal.     Right Achilles Tendon: Normal.     Left ankle: Normal.     Left Achilles Tendon: Normal.     Right foot: Normal.     Left foot: Normal.  Feet:     Right foot:     Skin integrity: Skin integrity normal.     Toenail Condition: Right toenails are normal.     Left foot:     Skin integrity: Skin integrity normal.     Toenail Condition: Left toenails are normal.  Lymphadenopathy:     Cervical: No cervical adenopathy.     Upper Body:     Right upper body: No supraclavicular, axillary or pectoral adenopathy.     Left upper body: No supraclavicular, axillary or pectoral adenopathy.     Lower Body: No right inguinal adenopathy. No left inguinal adenopathy.  Skin:    General: Skin is warm and dry.     Capillary Refill: Capillary refill takes less than 2 seconds.  Neurological:     General: No focal deficit present.     Mental Status: She is alert and oriented to person, place, and time.     Sensory: No sensory deficit.     Motor: No weakness.     Gait: Gait normal.  Psychiatric:        Attention and Perception: Attention normal.        Mood and Affect: Mood normal.        Behavior: Behavior normal. Behavior is cooperative.        Cognition and Memory: Cognition and memory normal.     Results for orders placed or performed in visit on 06/21/20  CBC  Result Value Ref Range   WBC 6.1 3.8 - 10.8 Thousand/uL   RBC 3.46 (L) 3.80 - 5.10 Million/uL   Hemoglobin 10.7 (L) 11.7 - 15.5 g/dL   HCT 31.6 (L) 35 - 45 %   MCV 91.3 80.0 - 100.0 fL   MCH 30.9 27.0 - 33.0 pg   MCHC 33.9 32.0 - 36.0 g/dL   RDW 12.5 11.0 - 15.0 %   Platelets 242 140 - 400 Thousand/uL   MPV 10.8 7.5  - 12.5 fL  COMPLETE METABOLIC PANEL WITH  GFR  Result Value Ref Range   Glucose, Bld 89 65 - 99 mg/dL   BUN 18 7 - 25 mg/dL   Creat 0.79 0.50 - 1.05 mg/dL   GFR, Est Non African American 83 > OR = 60 mL/min/1.65m2   GFR, Est African American 96 > OR = 60 mL/min/1.34m2   BUN/Creatinine Ratio NOT APPLICABLE 6 - 22 (calc)   Sodium 141 135 - 146 mmol/L   Potassium 3.5 3.5 - 5.3 mmol/L   Chloride 102 98 - 110 mmol/L   CO2 33 (H) 20 - 32 mmol/L   Calcium 9.1 8.6 - 10.4 mg/dL   Total Protein 7.0 6.1 - 8.1 g/dL   Albumin 4.5 3.6 - 5.1 g/dL   Globulin 2.5 1.9 - 3.7 g/dL (calc)   AG Ratio 1.8 1.0 - 2.5 (calc)   Total Bilirubin 0.5 0.2 - 1.2 mg/dL   Alkaline phosphatase (APISO) 119 37 - 153 U/L   AST 18 10 - 35 U/L   ALT 21 6 - 29 U/L  Lipid panel  Result Value Ref Range   Cholesterol 155 <200 mg/dL   HDL 54 > OR = 50 mg/dL   Triglycerides 70 <150 mg/dL   LDL Cholesterol (Calc) 85 mg/dL (calc)   Total CHOL/HDL Ratio 2.9 <5.0 (calc)   Non-HDL Cholesterol (Calc) 101 <130 mg/dL (calc)      Assessment & Plan:   Problem List Items Addressed This Visit      Cardiovascular and Mediastinum   Essential hypertension, benign (Chronic)    Blood pressure well controlled today. Continue amlodipine-benazepril 2.5-10 mg daily. Continue aspirin 325 mg as prophylactic for history of CVA. Continue atorvastatin 40 mg  Refills provided on medications. Follow-up in approximately 1 year for annual physical exam or sooner if needed.      Relevant Medications   amlodipine-benazepril (LOTREL) 2.5-10 MG capsule   aspirin EC 325 MG tablet   atorvastatin (LIPITOR) 40 MG tablet     Digestive   GERD (gastroesophageal reflux disease) (Chronic)   Relevant Medications   pantoprazole (PROTONIX) 40 MG tablet     Other   Hyperlipidemia LDL goal <100 (Chronic)    History of hyperlipidemia.  Recent lab results show LDL well controlled at less than 100.  No noted side effects from medication. Continue blood  pressure medications to keep blood pressure in range. Continue aspirin 325 mg daily for prophylactic for past history of CVA. Continue atorvastatin 40 mg daily for hyperlipidemia. Discussed with patient with a personal history of CVA and family history of cardiovascular disease continuation of this medication is best at this time. Refills provided today. Follow-up in 1 year for annual physical exam or sooner if needed.      Relevant Medications   amlodipine-benazepril (LOTREL) 2.5-10 MG capsule   aspirin EC 325 MG tablet   atorvastatin (LIPITOR) 40 MG tablet   History of CVA (cerebrovascular accident) without residual deficits (Chronic)    History of cerebellar clot in 2012.  No residual side effects. Continue aspirin 325 mg daily as prophylactic. Continue to control blood pressure with amlodipine-benazepril daily. Continue atorvastatin daily. Discussed with patient the need to keep good control on blood pressure and lipids to prevent future complications. Refills provided on medication today. Follow-up in approximately 12 months for annual physical exam or sooner if needed.       Relevant Medications   atorvastatin (LIPITOR) 40 MG tablet   Depression (Chronic)    Depression and anxiety symptoms well controlled on daily  medication. Continue escitalopram 10 mg daily and as needed alprazolam 0.25 to 05 mg (up to twice daily). Strong recommendation to limit alprazolam use-patient has had a prescription for 30 tablets that has lasted for a year now. Discussed the grief process and the time it takes for healing to take place.  Overall feels she is doing very well. Refills provided today. PDMP reviewed today. Follow-up in 1 year for annual physical exam or sooner if needed.      Relevant Medications   ALPRAZolam (XANAX) 0.5 MG tablet   escitalopram (LEXAPRO) 10 MG tablet   Anemia    Chronic, lifelong history of anemia.  Most recent labs are consistent with how labs have been in the  past, actually a little better than they were this time last year. She is asymptomatic and given the life long presents this is likely her baseline. Recommend diet rich in iron. Follow-up in approximately 1 year or sooner if needed.      Encounter for annual physical exam - Primary    Annual physical exam and overall healthy 59 year Stanton female. Medications refilled for hypertension, hyperlipidemia, depression/anxiety, GERD, and aspirin prophylaxis for history of CVA. She is up-to-date on all of her current recommended screenings. Discussed screening intervals for Pap and HPV testing, she will need these repeated in January 2025. Discussed she will need Tdap booster next year Discussed pneumonia vaccines starting at age 9 Discussed mammogram next due in January 2022 Discussed colonoscopy next due in 2025 Discussed DEXA scan beginning at age 4. Follow-up in approximately 1 year for annual physical exam or sooner if needed.        Other Visit Diagnoses    Need for influenza vaccination       Relevant Orders   Flu Vaccine QUAD 36+ mos IM (Completed)       Follow up plan: Return in about 1 year (around 07/24/2021) for Annual Physical Exam.   LABORATORY TESTING:  - Pap smear: up to date- Last Pap 11/16/2018- normal results- next Pap due 11/17/2023 - STI testing: deferred  IMMUNIZATIONS:   - Tdap: Tetanus vaccination status reviewed: last tetanus booster within 10 years. Last Tdap on file 2012- booster will be due next year - Influenza: Administered today - Pneumovax: Not applicable- Plan to start age 66 - Prevnar: Not applicable- Plan to start 1 year after Pneumovax - HPV: Not applicable - Shingrix/Zostavax vaccine: 1 dose Shingrix given 2019- pt not wishing to complete the series due to side effects  SCREENING: -Mammogram: Up to date - Last mammogram 11/21/2019- next due 11/20/2020 - Colonoscopy: Up to date - Last Colonoscopy 2015- due in 2025 - Bone Density: Not applicable  - Plan to start at age 34    PATIENT COUNSELING:   Advised to take 1 mg of folate supplement per day if capable of pregnancy.   Sexuality: Discussed sexually transmitted diseases, partner selection, use of condoms, avoidance of unintended pregnancy  and contraceptive alternatives.   Advised to avoid cigarette smoking.  I discussed with the patient that most people either abstain from alcohol or drink within safe limits (<=14/week and <=4 drinks/occasion for males, <=7/weeks and <= 3 drinks/occasion for females) and that the risk for alcohol disorders and other health effects rises proportionally with the number of drinks per week and how often a drinker exceeds daily limits.  Discussed cessation/primary prevention of drug use and availability of treatment for abuse.   Diet: Encouraged to adjust caloric intake to maintain  or achieve ideal body  weight, to reduce intake of dietary saturated fat and total fat, to limit sodium intake by avoiding high sodium foods and not adding table salt, and to maintain adequate dietary potassium and calcium preferably from fresh fruits, vegetables, and low-fat dairy products.    stressed the importance of regular exercise  Injury prevention: Discussed safety belts, safety helmets, smoke detector, smoking near bedding or upholstery.   Dental health: Discussed importance of regular tooth brushing, flossing, and dental visits.    NEXT PREVENTATIVE PHYSICAL DUE IN 1 YEAR. Return in about 1 year (around 07/24/2021) for Annual Physical Exam.

## 2020-07-24 ENCOUNTER — Encounter: Payer: Self-pay | Admitting: Nurse Practitioner

## 2020-07-24 ENCOUNTER — Other Ambulatory Visit: Payer: Self-pay

## 2020-07-24 ENCOUNTER — Ambulatory Visit (INDEPENDENT_AMBULATORY_CARE_PROVIDER_SITE_OTHER): Payer: 59 | Admitting: Nurse Practitioner

## 2020-07-24 ENCOUNTER — Other Ambulatory Visit: Payer: Self-pay | Admitting: Nurse Practitioner

## 2020-07-24 VITALS — BP 127/81 | HR 77 | Temp 98.3°F | Ht 65.0 in | Wt 208.4 lb

## 2020-07-24 DIAGNOSIS — K2100 Gastro-esophageal reflux disease with esophagitis, without bleeding: Secondary | ICD-10-CM | POA: Diagnosis not present

## 2020-07-24 DIAGNOSIS — E785 Hyperlipidemia, unspecified: Secondary | ICD-10-CM | POA: Diagnosis not present

## 2020-07-24 DIAGNOSIS — Z8673 Personal history of transient ischemic attack (TIA), and cerebral infarction without residual deficits: Secondary | ICD-10-CM

## 2020-07-24 DIAGNOSIS — I10 Essential (primary) hypertension: Secondary | ICD-10-CM | POA: Diagnosis not present

## 2020-07-24 DIAGNOSIS — D649 Anemia, unspecified: Secondary | ICD-10-CM | POA: Diagnosis not present

## 2020-07-24 DIAGNOSIS — Z23 Encounter for immunization: Secondary | ICD-10-CM

## 2020-07-24 DIAGNOSIS — F3342 Major depressive disorder, recurrent, in full remission: Secondary | ICD-10-CM | POA: Diagnosis not present

## 2020-07-24 DIAGNOSIS — Z0000 Encounter for general adult medical examination without abnormal findings: Secondary | ICD-10-CM | POA: Diagnosis not present

## 2020-07-24 MED ORDER — PANTOPRAZOLE SODIUM 40 MG PO TBEC: 40.0000 mg | DELAYED_RELEASE_TABLET | Freq: Two times a day (BID) | ORAL | 3 refills | Status: DC

## 2020-07-24 MED ORDER — ALPRAZOLAM 0.5 MG PO TABS: 0.2500 mg | ORAL_TABLET | Freq: Two times a day (BID) | ORAL | 0 refills | Status: DC | PRN

## 2020-07-24 MED ORDER — ATORVASTATIN CALCIUM 40 MG PO TABS: ORAL_TABLET | ORAL | 3 refills | Status: DC

## 2020-07-24 MED ORDER — ESCITALOPRAM OXALATE 10 MG PO TABS: 10.0000 mg | ORAL_TABLET | Freq: Every day | ORAL | 3 refills | Status: DC

## 2020-07-24 MED ORDER — AMLODIPINE BESY-BENAZEPRIL HCL 2.5-10 MG PO CAPS: 1.0000 | ORAL_CAPSULE | Freq: Every day | ORAL | 3 refills | Status: DC

## 2020-07-24 MED ORDER — ASPIRIN EC 325 MG PO TBEC: DELAYED_RELEASE_TABLET | ORAL | 3 refills | Status: DC

## 2020-07-24 MED FILL — ALPRAZolam 0.5 MG TABS: 0.5 | 15 days supply | Qty: 30 | Fill #0

## 2020-07-24 MED FILL — ATORVASTATIN 40 MG TABLET: 40 | 90 days supply | Qty: 90 | Fill #0

## 2020-07-24 MED FILL — ASPIRIN EC 325 MG TABLET: 325 | 90 days supply | Qty: 90 | Fill #0

## 2020-07-24 MED FILL — PANTOPRAZOLE SOD DR 40 MG T: 40 | 90 days supply | Qty: 180 | Fill #0

## 2020-07-24 MED FILL — ESCITALOPRAM 10 MG TABLET: 10 | 90 days supply | Qty: 90 | Fill #0

## 2020-07-24 NOTE — Assessment & Plan Note (Signed)
Chronic, lifelong history of anemia.  Most recent labs are consistent with how labs have been in the past, actually a little better than they were this time last year. She is asymptomatic and given the life long presents this is likely her baseline. Recommend diet rich in iron. Follow-up in approximately 1 year or sooner if needed.

## 2020-07-24 NOTE — Patient Instructions (Addendum)
Health Maintenance, Female Adopting a healthy lifestyle and getting preventive care are important in promoting health and wellness. Ask your health care provider about:  The right schedule for you to have regular tests and exams.  Things you can do on your own to prevent diseases and keep yourself healthy. What should I know about diet, weight, and exercise? Eat a healthy diet   Eat a diet that includes plenty of vegetables, fruits, low-fat dairy products, and lean protein.  Do not eat a lot of foods that are high in solid fats, added sugars, or sodium. Maintain a healthy weight Body mass index (BMI) is used to identify weight problems. It estimates body fat based on height and weight. Your health care provider can help determine your BMI and help you achieve or maintain a healthy weight. Get regular exercise Get regular exercise. This is one of the most important things you can do for your health. Most adults should:  Exercise for at least 150 minutes each week. The exercise should increase your heart rate and make you sweat (moderate-intensity exercise).  Do strengthening exercises at least twice a week. This is in addition to the moderate-intensity exercise.  Spend less time sitting. Even light physical activity can be beneficial. Watch cholesterol and blood lipids Have your blood tested for lipids and cholesterol at 57 years of age, then have this test every 5 years. Have your cholesterol levels checked more often if:  Your lipid or cholesterol levels are high.  You are older than 57 years of age.  You are at high risk for heart disease. What should I know about cancer screening? Depending on your health history and family history, you may need to have cancer screening at various ages. This may include screening for:  Breast cancer.  Cervical cancer.  Colorectal cancer.  Skin cancer.  Lung cancer. What should I know about heart disease, diabetes, and high blood  pressure? Blood pressure and heart disease  High blood pressure causes heart disease and increases the risk of stroke. This is more likely to develop in people who have high blood pressure readings, are of African descent, or are overweight.  Have your blood pressure checked: ? Every 3-5 years if you are 18-39 years of age. ? Every year if you are 40 years old or older. Diabetes Have regular diabetes screenings. This checks your fasting blood sugar level. Have the screening done:  Once every three years after age 40 if you are at a normal weight and have a low risk for diabetes.  More often and at a younger age if you are overweight or have a high risk for diabetes. What should I know about preventing infection? Hepatitis B If you have a higher risk for hepatitis B, you should be screened for this virus. Talk with your health care provider to find out if you are at risk for hepatitis B infection. Hepatitis C Testing is recommended for:  Everyone born from 1945 through 1965.  Anyone with known risk factors for hepatitis C. Sexually transmitted infections (STIs)  Get screened for STIs, including gonorrhea and chlamydia, if: ? You are sexually active and are younger than 57 years of age. ? You are older than 57 years of age and your health care provider tells you that you are at risk for this type of infection. ? Your sexual activity has changed since you were last screened, and you are at increased risk for chlamydia or gonorrhea. Ask your health care provider if   you are at risk.  Ask your health care provider about whether you are at high risk for HIV. Your health care provider may recommend a prescription medicine to help prevent HIV infection. If you choose to take medicine to prevent HIV, you should first get tested for HIV. You should then be tested every 3 months for as long as you are taking the medicine. Pregnancy  If you are about to stop having your period (premenopausal) and  you may become pregnant, seek counseling before you get pregnant.  Take 400 to 800 micrograms (mcg) of folic acid every day if you become pregnant.  Ask for birth control (contraception) if you want to prevent pregnancy. Osteoporosis and menopause Osteoporosis is a disease in which the bones lose minerals and strength with aging. This can result in bone fractures. If you are 65 years old or older, or if you are at risk for osteoporosis and fractures, ask your health care provider if you should:  Be screened for bone loss.  Take a calcium or vitamin D supplement to lower your risk of fractures.  Be given hormone replacement therapy (HRT) to treat symptoms of menopause. Follow these instructions at home: Lifestyle  Do not use any products that contain nicotine or tobacco, such as cigarettes, e-cigarettes, and chewing tobacco. If you need help quitting, ask your health care provider.  Do not use street drugs.  Do not share needles.  Ask your health care provider for help if you need support or information about quitting drugs. Alcohol use  Do not drink alcohol if: ? Your health care provider tells you not to drink. ? You are pregnant, may be pregnant, or are planning to become pregnant.  If you drink alcohol: ? Limit how much you use to 0-1 drink a day. ? Limit intake if you are breastfeeding.  Be aware of how much alcohol is in your drink. In the [place] one drink equals one 12 oz bottle of beer (355 mL), one 5 oz glass of wine (148 mL), or one 1 oz glass of hard liquor (44 mL). General instructions  Schedule regular health, dental, and eye exams.  Stay current with your vaccines.  Tell your health care provider if: ? You often feel depressed. ? You have ever been abused or do not feel safe at home. Summary  Adopting a healthy lifestyle and getting preventive care are important in promoting health and wellness.  Follow your health care provider's instructions about healthy  diet, exercising, and getting tested or screened for diseases.  Follow your health care provider's instructions on monitoring your cholesterol and blood pressure. This information is not intended to replace advice given to you by your health care provider. Make sure you discuss any questions you have with your health care provider. Document Revised: 10/07/2018 Document Reviewed: 10/07/2018 Elsevier Patient Education  2020 [employer] Influenza (Flu) Vaccine (Inactivated or Recombinant): What You Need to Know 1. Why get vaccinated? Influenza vaccine can prevent influenza (flu). Flu is a contagious disease that spreads around the [place] every year, usually between October and May. Anyone can get the flu, but it is more dangerous for some people. Infants and young children, people 65 years of age and older, pregnant women, and people with certain health conditions or a weakened immune system are at greatest risk of flu complications. Pneumonia, bronchitis, sinus infections and ear infections are examples of flu-related complications. If you have a medical condition, such as heart disease, cancer or diabetes, flu   can make it worse. Flu can cause fever and chills, sore throat, muscle aches, fatigue, cough, headache, and runny or stuffy nose. Some people may have vomiting and diarrhea, though this is more common in children than adults. Each year thousands of people in the [place] die from flu, and many more are hospitalized. Flu vaccine prevents millions of illnesses and flu-related visits to the doctor each year. 2. Influenza vaccine CDC recommends everyone 6 months of age and older get vaccinated every flu season. Children 6 months through 8 years of age may need 2 doses during a single flu season. Everyone else needs only 1 dose each flu season. It takes about 2 weeks for protection to develop after vaccination. There are many flu viruses, and they are always changing. Each year a new  flu vaccine is made to protect against three or four viruses that are likely to cause disease in the upcoming flu season. Even when the vaccine doesn't exactly match these viruses, it may still provide some protection. Influenza vaccine does not cause flu. Influenza vaccine may be given at the same time as other vaccines. 3. Talk with your health care provider Tell your vaccine provider if the person getting the vaccine:  Has had an allergic reaction after a previous dose of influenza vaccine, or has any severe, life-threatening allergies.  Has ever had Guillain-Barr Syndrome (also called GBS). In some cases, your health care provider may decide to postpone influenza vaccination to a future visit. People with minor illnesses, such as a cold, may be vaccinated. People who are moderately or severely ill should usually wait until they recover before getting influenza vaccine. Your health care provider can give you more information. 4. Risks of a vaccine reaction  Soreness, redness, and swelling where shot is given, fever, muscle aches, and headache can happen after influenza vaccine.  There may be a very small increased risk of Guillain-Barr Syndrome (GBS) after inactivated influenza vaccine (the flu shot). Young children who get the flu shot along with pneumococcal vaccine (PCV13), and/or DTaP vaccine at the same time might be slightly more likely to have a seizure caused by fever. Tell your health care provider if a child who is getting flu vaccine has ever had a seizure. People sometimes faint after medical procedures, including vaccination. Tell your provider if you feel dizzy or have vision changes or ringing in the ears. As with any medicine, there is a very remote chance of a vaccine causing a severe allergic reaction, other serious injury, or death. 5. What if there is a serious problem? An allergic reaction could occur after the vaccinated person leaves the clinic. If you see signs of a  severe allergic reaction (hives, swelling of the face and throat, difficulty breathing, a fast heartbeat, dizziness, or weakness), call 9-1-1 and get the person to the nearest hospital. For other signs that concern you, call your health care provider. Adverse reactions should be reported to the Vaccine Adverse Event Reporting System (VAERS). Your health care provider will usually file this report, or you can do it yourself. Visit the VAERS website at www.vaers.hhs.gov or call 1-800-822-7967.VAERS is only for reporting reactions, and VAERS staff do not give medical advice. 6. The National Vaccine Injury Compensation Program The National Vaccine Injury Compensation Program (VICP) is a federal program that was created to compensate people who may have been injured by certain vaccines. Visit the VICP website at www.hrsa.gov/vaccinecompensation or call 1-800-338-2382 to learn about the program and about filing a claim.   There is a time limit to file a claim for compensation. 7. How can I learn more?  Ask your healthcare provider.  Call your local or state health department.  Contact the Centers for Disease Control and Prevention (CDC): ? Call 1-800-232-4636 (1-800-CDC-INFO) or ? Visit CDC's www.cdc.gov/flu Vaccine Information Statement (Interim) Inactivated Influenza Vaccine (06/11/2018) This information is not intended to replace advice given to you by your health care provider. Make sure you discuss any questions you have with your health care provider. Document Revised: 02/02/2019 Document Reviewed: 06/15/2018 Elsevier Patient Education  2020 [employer] Inc[employer]  

## 2020-07-24 NOTE — Assessment & Plan Note (Signed)
History of hyperlipidemia.  Recent lab results show LDL well controlled at less than 100.  No noted side effects from medication. Continue blood pressure medications to keep blood pressure in range. Continue aspirin 325 mg daily for prophylactic for past history of CVA. Continue atorvastatin 40 mg daily for hyperlipidemia. Discussed with patient with a personal history of CVA and family history of cardiovascular disease continuation of this medication is best at this time. Refills provided today. Follow-up in 1 year for annual physical exam or sooner if needed.

## 2020-07-24 NOTE — Assessment & Plan Note (Signed)
Blood pressure well controlled today. Continue amlodipine-benazepril 2.5-10 mg daily. Continue aspirin 325 mg as prophylactic for history of CVA. Continue atorvastatin 40 mg  Refills provided on medications. Follow-up in approximately 1 year for annual physical exam or sooner if needed.

## 2020-07-24 NOTE — Assessment & Plan Note (Signed)
>>  ASSESSMENT AND PLAN FOR DEPRESSION WRITTEN ON 07/24/2020 10:43 AM BY EARLY, SARA E, NP  Depression and anxiety symptoms well controlled on daily medication. Continue escitalopram 10 mg daily and as needed alprazolam 0.25 to 05 mg (up to twice daily). Strong recommendation to limit alprazolam use-patient has had a prescription for 30 tablets that has lasted for a year now. Discussed the grief process and the time it takes for healing to take place.  Overall feels she is doing very well. Refills provided today. PDMP reviewed today. Follow-up in 1 year for annual physical exam or sooner if needed.

## 2020-07-24 NOTE — Assessment & Plan Note (Signed)
Depression and anxiety symptoms well controlled on daily medication. Continue escitalopram 10 mg daily and as needed alprazolam 0.25 to 05 mg (up to twice daily). Strong recommendation to limit alprazolam use-patient has had a prescription for 30 tablets that has lasted for a year now. Discussed the grief process and the time it takes for healing to take place.  Overall feels she is doing very well. Refills provided today. PDMP reviewed today. Follow-up in 1 year for annual physical exam or sooner if needed.

## 2020-07-24 NOTE — Assessment & Plan Note (Signed)
Annual physical exam and overall healthy 59 year old female. Medications refilled for hypertension, hyperlipidemia, depression/anxiety, GERD, and aspirin prophylaxis for history of CVA. She is up-to-date on all of her current recommended screenings. Discussed screening intervals for Pap and HPV testing, she will need these repeated in January 2025. Discussed she will need Tdap booster next year Discussed pneumonia vaccines starting at age 55 Discussed mammogram next due in January 2022 Discussed colonoscopy next due in 2025 Discussed DEXA scan beginning at age 75. Follow-up in approximately 1 year for annual physical exam or sooner if needed.

## 2020-07-24 NOTE — Assessment & Plan Note (Signed)
History of cerebellar clot in 2012.  No residual side effects. Continue aspirin 325 mg daily as prophylactic. Continue to control blood pressure with amlodipine-benazepril daily. Continue atorvastatin daily. Discussed with patient the need to keep good control on blood pressure and lipids to prevent future complications. Refills provided on medication today. Follow-up in approximately 12 months for annual physical exam or sooner if needed.

## 2020-10-13 MED FILL — AMLODIPINE-BENAZEPRIL 2.5-1: 2.5-10 | 90 days supply | Qty: 90 | Fill #0

## 2020-11-02 MED FILL — ATORVASTATIN CALCIUM 40 MG: 40 | 90 days supply | Qty: 90 | Fill #1

## 2020-11-02 MED FILL — ESCITALOPRAM 10 MG TABLET: 10 | 90 days supply | Qty: 90 | Fill #1

## 2020-11-02 MED FILL — PANTOPRAZOLE SOD DR 40 MG T: 40 | 90 days supply | Qty: 180 | Fill #1

## 2020-11-02 MED FILL — ASPIRIN EC 325 MG TABLET: 325 | 100 days supply | Qty: 100 | Fill #1

## 2020-11-08 DIAGNOSIS — M25562 Pain in left knee: Secondary | ICD-10-CM | POA: Diagnosis not present

## 2020-11-08 DIAGNOSIS — M2342 Loose body in knee, left knee: Secondary | ICD-10-CM | POA: Diagnosis not present

## 2020-12-05 DIAGNOSIS — M2342 Loose body in knee, left knee: Secondary | ICD-10-CM | POA: Diagnosis not present

## 2020-12-08 DIAGNOSIS — M2342 Loose body in knee, left knee: Secondary | ICD-10-CM | POA: Diagnosis not present

## 2020-12-25 DIAGNOSIS — Z20822 Contact with and (suspected) exposure to covid-19: Secondary | ICD-10-CM | POA: Diagnosis not present

## 2020-12-25 DIAGNOSIS — Z01812 Encounter for preprocedural laboratory examination: Secondary | ICD-10-CM | POA: Diagnosis not present

## 2020-12-28 ENCOUNTER — Other Ambulatory Visit (HOSPITAL_BASED_OUTPATIENT_CLINIC_OR_DEPARTMENT_OTHER): Payer: Self-pay

## 2020-12-28 DIAGNOSIS — E669 Obesity, unspecified: Secondary | ICD-10-CM | POA: Diagnosis not present

## 2020-12-28 DIAGNOSIS — K219 Gastro-esophageal reflux disease without esophagitis: Secondary | ICD-10-CM | POA: Diagnosis not present

## 2020-12-28 DIAGNOSIS — M6588 Other synovitis and tenosynovitis, other site: Secondary | ICD-10-CM | POA: Diagnosis not present

## 2020-12-28 DIAGNOSIS — M2342 Loose body in knee, left knee: Secondary | ICD-10-CM | POA: Diagnosis not present

## 2020-12-28 DIAGNOSIS — M65862 Other synovitis and tenosynovitis, left lower leg: Secondary | ICD-10-CM | POA: Diagnosis not present

## 2020-12-28 DIAGNOSIS — M2242 Chondromalacia patellae, left knee: Secondary | ICD-10-CM | POA: Diagnosis not present

## 2020-12-28 DIAGNOSIS — Z8673 Personal history of transient ischemic attack (TIA), and cerebral infarction without residual deficits: Secondary | ICD-10-CM | POA: Diagnosis not present

## 2020-12-28 DIAGNOSIS — I10 Essential (primary) hypertension: Secondary | ICD-10-CM | POA: Diagnosis not present

## 2020-12-28 DIAGNOSIS — G8918 Other acute postprocedural pain: Secondary | ICD-10-CM | POA: Diagnosis not present

## 2020-12-28 DIAGNOSIS — Z79899 Other long term (current) drug therapy: Secondary | ICD-10-CM | POA: Diagnosis not present

## 2020-12-28 DIAGNOSIS — M659 Synovitis and tenosynovitis, unspecified: Secondary | ICD-10-CM | POA: Diagnosis not present

## 2020-12-28 DIAGNOSIS — E785 Hyperlipidemia, unspecified: Secondary | ICD-10-CM | POA: Diagnosis not present

## 2020-12-28 MED FILL — HYDROCODON-APAP 5-325: 5-325 | 5 days supply | Qty: 20 | Fill #0

## 2020-12-28 MED FILL — ONDANSETRON HCL 4 MG TABLET: 4 | 8 days supply | Qty: 25 | Fill #0

## 2020-12-29 DIAGNOSIS — M25562 Pain in left knee: Secondary | ICD-10-CM | POA: Diagnosis not present

## 2020-12-29 DIAGNOSIS — R6889 Other general symptoms and signs: Secondary | ICD-10-CM | POA: Diagnosis not present

## 2020-12-29 DIAGNOSIS — M2342 Loose body in knee, left knee: Secondary | ICD-10-CM | POA: Diagnosis not present

## 2021-01-05 DIAGNOSIS — R6889 Other general symptoms and signs: Secondary | ICD-10-CM | POA: Diagnosis not present

## 2021-01-05 DIAGNOSIS — M25562 Pain in left knee: Secondary | ICD-10-CM | POA: Diagnosis not present

## 2021-01-17 MED FILL — AMLODIPINE-BENAZEPRIL 2.5-1: 2.5-10 | 90 days supply | Qty: 90 | Fill #1

## 2021-01-18 DIAGNOSIS — M25562 Pain in left knee: Secondary | ICD-10-CM | POA: Diagnosis not present

## 2021-01-18 DIAGNOSIS — R6889 Other general symptoms and signs: Secondary | ICD-10-CM | POA: Diagnosis not present

## 2021-01-25 DIAGNOSIS — M25562 Pain in left knee: Secondary | ICD-10-CM | POA: Diagnosis not present

## 2021-01-25 DIAGNOSIS — R6889 Other general symptoms and signs: Secondary | ICD-10-CM | POA: Diagnosis not present

## 2021-02-01 DIAGNOSIS — R6889 Other general symptoms and signs: Secondary | ICD-10-CM | POA: Diagnosis not present

## 2021-02-01 DIAGNOSIS — M25562 Pain in left knee: Secondary | ICD-10-CM | POA: Diagnosis not present

## 2021-02-08 DIAGNOSIS — M25562 Pain in left knee: Secondary | ICD-10-CM | POA: Diagnosis not present

## 2021-02-08 DIAGNOSIS — R6889 Other general symptoms and signs: Secondary | ICD-10-CM | POA: Diagnosis not present

## 2021-02-14 ENCOUNTER — Other Ambulatory Visit (HOSPITAL_BASED_OUTPATIENT_CLINIC_OR_DEPARTMENT_OTHER): Payer: Self-pay

## 2021-02-14 MED FILL — Aspirin Tab Delayed Release 325 MG: ORAL | 90 days supply | Qty: 100 | Fill #0 | Status: AC

## 2021-02-14 MED FILL — Escitalopram Oxalate Tab 10 MG (Base Equiv): ORAL | 90 days supply | Qty: 90 | Fill #0 | Status: AC

## 2021-02-14 MED FILL — Pantoprazole Sodium EC Tab 40 MG (Base Equiv): ORAL | 90 days supply | Qty: 180 | Fill #0 | Status: AC

## 2021-02-14 MED FILL — Atorvastatin Calcium Tab 40 MG (Base Equivalent): ORAL | 90 days supply | Qty: 90 | Fill #0 | Status: AC

## 2021-03-15 ENCOUNTER — Other Ambulatory Visit (HOSPITAL_BASED_OUTPATIENT_CLINIC_OR_DEPARTMENT_OTHER): Payer: Self-pay

## 2021-03-15 MED ORDER — AMOXICILLIN 500 MG PO CAPS
ORAL_CAPSULE | ORAL | 0 refills | Status: DC
Start: 2021-03-15 — End: 2022-07-25
  Filled 2021-03-15: qty 22, 7d supply, fill #0

## 2021-03-15 MED ORDER — CYCLOBENZAPRINE HCL 10 MG PO TABS
ORAL_TABLET | ORAL | 0 refills | Status: DC
Start: 2021-03-15 — End: 2022-07-25
  Filled 2021-03-15: qty 5, 2d supply, fill #0

## 2021-03-23 ENCOUNTER — Other Ambulatory Visit (HOSPITAL_BASED_OUTPATIENT_CLINIC_OR_DEPARTMENT_OTHER): Payer: Self-pay

## 2021-04-16 ENCOUNTER — Other Ambulatory Visit (HOSPITAL_BASED_OUTPATIENT_CLINIC_OR_DEPARTMENT_OTHER): Payer: Self-pay

## 2021-04-16 MED ORDER — AMOXICILLIN 500 MG PO CAPS
ORAL_CAPSULE | ORAL | 0 refills | Status: DC
  Filled 2021-04-16: qty 21, 7d supply, fill #0

## 2021-04-20 ENCOUNTER — Other Ambulatory Visit (HOSPITAL_BASED_OUTPATIENT_CLINIC_OR_DEPARTMENT_OTHER): Payer: Self-pay

## 2021-04-20 MED FILL — Amlodipine Besylate-Benazepril HCl Cap 2.5-10 MG: ORAL | 90 days supply | Qty: 90 | Fill #0 | Status: AC

## 2021-05-14 ENCOUNTER — Other Ambulatory Visit (HOSPITAL_BASED_OUTPATIENT_CLINIC_OR_DEPARTMENT_OTHER): Payer: Self-pay

## 2021-05-14 MED FILL — Escitalopram Oxalate Tab 10 MG (Base Equiv): ORAL | 90 days supply | Qty: 90 | Fill #1 | Status: AC

## 2021-05-14 MED FILL — Atorvastatin Calcium Tab 40 MG (Base Equivalent): ORAL | 90 days supply | Qty: 90 | Fill #1 | Status: AC

## 2021-05-14 MED FILL — Pantoprazole Sodium EC Tab 40 MG (Base Equiv): ORAL | 90 days supply | Qty: 180 | Fill #1 | Status: AC

## 2021-06-28 ENCOUNTER — Other Ambulatory Visit: Payer: Self-pay

## 2021-06-28 ENCOUNTER — Encounter: Payer: Self-pay | Admitting: Osteopathic Medicine

## 2021-06-28 DIAGNOSIS — E785 Hyperlipidemia, unspecified: Secondary | ICD-10-CM

## 2021-06-28 DIAGNOSIS — I10 Essential (primary) hypertension: Secondary | ICD-10-CM

## 2021-06-28 DIAGNOSIS — D649 Anemia, unspecified: Secondary | ICD-10-CM

## 2021-07-17 ENCOUNTER — Other Ambulatory Visit: Payer: Self-pay

## 2021-07-17 ENCOUNTER — Telehealth: Payer: Self-pay | Admitting: Osteopathic Medicine

## 2021-07-17 ENCOUNTER — Other Ambulatory Visit (HOSPITAL_BASED_OUTPATIENT_CLINIC_OR_DEPARTMENT_OTHER): Payer: Self-pay

## 2021-07-17 MED FILL — Amlodipine Besylate-Benazepril HCl Cap 2.5-10 MG: ORAL | 90 days supply | Qty: 90 | Fill #1 | Status: AC

## 2021-07-17 NOTE — Telephone Encounter (Signed)
Pt is overdue for an OV. She was scheduled to see Dr. Loni Muse on 07/19/21 but it got cancelled. She said she does not need refills necessarily at this time but she will need them soon. She said that she wants to establish with [facility]. Call transferred to the front desk for scheduling.

## 2021-07-17 NOTE — Telephone Encounter (Signed)
Pt called. She needs a refill on all of her meds.  Thank you.

## 2021-07-17 NOTE — Telephone Encounter (Signed)
Pt is due for an OV. Call transferred to the front desk for scheduling. She said she is not necessarily due now for refills but will need them soon.

## 2021-07-19 ENCOUNTER — Encounter: Payer: 59 | Admitting: Osteopathic Medicine

## 2021-07-20 DIAGNOSIS — I10 Essential (primary) hypertension: Secondary | ICD-10-CM | POA: Diagnosis not present

## 2021-07-20 DIAGNOSIS — D649 Anemia, unspecified: Secondary | ICD-10-CM | POA: Diagnosis not present

## 2021-07-20 DIAGNOSIS — E785 Hyperlipidemia, unspecified: Secondary | ICD-10-CM | POA: Diagnosis not present

## 2021-07-21 LAB — COMPREHENSIVE METABOLIC PANEL
AG Ratio: 1.8 (calc) (ref 1.0–2.5)
ALT: 23 U/L (ref 6–29)
AST: 24 U/L (ref 10–35)
Albumin: 4.4 g/dL (ref 3.6–5.1)
Alkaline phosphatase (APISO): 103 U/L (ref 37–153)
BUN: 14 mg/dL (ref 7–25)
CO2: 30 mmol/L (ref 20–32)
Calcium: 8.9 mg/dL (ref 8.6–10.4)
Chloride: 102 mmol/L (ref 98–110)
Creat: 0.79 mg/dL (ref 0.50–1.03)
Globulin: 2.5 g/dL (calc) (ref 1.9–3.7)
Glucose, Bld: 85 mg/dL (ref 65–99)
Potassium: 3.6 mmol/L (ref 3.5–5.3)
Sodium: 141 mmol/L (ref 135–146)
Total Bilirubin: 0.5 mg/dL (ref 0.2–1.2)
Total Protein: 6.9 g/dL (ref 6.1–8.1)

## 2021-07-21 LAB — CBC WITH DIFFERENTIAL/PLATELET
Absolute Monocytes: 281 cells/uL (ref 200–950)
Basophils Absolute: 38 cells/uL (ref 0–200)
Basophils Relative: 0.7 %
Eosinophils Absolute: 108 cells/uL (ref 15–500)
Eosinophils Relative: 2 %
HCT: 31.8 % — ABNORMAL LOW (ref 35.0–45.0)
Hemoglobin: 10.7 g/dL — ABNORMAL LOW (ref 11.7–15.5)
Lymphs Abs: 1318 cells/uL (ref 850–3900)
MCH: 30.7 pg (ref 27.0–33.0)
MCHC: 33.6 g/dL (ref 32.0–36.0)
MCV: 91.4 fL (ref 80.0–100.0)
MPV: 10.6 fL (ref 7.5–12.5)
Monocytes Relative: 5.2 %
Neutro Abs: 3656 cells/uL (ref 1500–7800)
Neutrophils Relative %: 67.7 %
Platelets: 223 10*3/uL (ref 140–400)
RBC: 3.48 10*6/uL — ABNORMAL LOW (ref 3.80–5.10)
RDW: 12.9 % (ref 11.0–15.0)
Total Lymphocyte: 24.4 %
WBC: 5.4 10*3/uL (ref 3.8–10.8)

## 2021-07-21 LAB — LIPID PANEL
Cholesterol: 178 mg/dL (ref <200)
HDL: 67 mg/dL (ref >=50)
LDL Cholesterol (Calc): 92 mg/dL (calc)
Non-HDL Cholesterol (Calc): 111 mg/dL (calc) (ref <130)
Total CHOL/HDL Ratio: 2.7 (calc) (ref <5.0)
Triglycerides: 93 mg/dL (ref <150)

## 2021-07-24 ENCOUNTER — Other Ambulatory Visit: Payer: Self-pay

## 2021-07-24 ENCOUNTER — Other Ambulatory Visit (HOSPITAL_BASED_OUTPATIENT_CLINIC_OR_DEPARTMENT_OTHER): Payer: Self-pay

## 2021-07-24 ENCOUNTER — Ambulatory Visit (INDEPENDENT_AMBULATORY_CARE_PROVIDER_SITE_OTHER): Payer: 59 | Admitting: Family Medicine

## 2021-07-24 ENCOUNTER — Encounter: Payer: Self-pay | Admitting: Family Medicine

## 2021-07-24 VITALS — BP 138/84 | HR 75 | Temp 98.6°F | Wt 191.1 lb

## 2021-07-24 DIAGNOSIS — I10 Essential (primary) hypertension: Secondary | ICD-10-CM

## 2021-07-24 DIAGNOSIS — Z23 Encounter for immunization: Secondary | ICD-10-CM | POA: Diagnosis not present

## 2021-07-24 DIAGNOSIS — Z8673 Personal history of transient ischemic attack (TIA), and cerebral infarction without residual deficits: Secondary | ICD-10-CM

## 2021-07-24 DIAGNOSIS — F3342 Major depressive disorder, recurrent, in full remission: Secondary | ICD-10-CM | POA: Diagnosis not present

## 2021-07-24 DIAGNOSIS — D649 Anemia, unspecified: Secondary | ICD-10-CM | POA: Diagnosis not present

## 2021-07-24 DIAGNOSIS — K2100 Gastro-esophageal reflux disease with esophagitis, without bleeding: Secondary | ICD-10-CM | POA: Diagnosis not present

## 2021-07-24 MED ORDER — ASPIRIN EC 325 MG PO TBEC
DELAYED_RELEASE_TABLET | Freq: Every day | ORAL | 3 refills | Status: AC
  Filled 2021-07-24: qty 100, 90d supply, fill #0
  Filled 2021-08-16: qty 100, 100d supply, fill #0
  Filled 2021-10-17: qty 100, 100d supply, fill #1

## 2021-07-24 MED ORDER — ALPRAZOLAM 0.5 MG PO TABS
0.2500 mg | ORAL_TABLET | Freq: Two times a day (BID) | ORAL | 0 refills | Status: DC | PRN
  Filled 2021-07-24 – 2021-08-16 (×2): qty 30, 15d supply, fill #0

## 2021-07-24 MED ORDER — ATORVASTATIN CALCIUM 40 MG PO TABS
ORAL_TABLET | Freq: Every day | ORAL | 3 refills | Status: DC
  Filled 2021-07-24: qty 90, fill #0
  Filled 2021-08-16: qty 90, 90d supply, fill #0
  Filled 2021-12-18: qty 90, 90d supply, fill #1
  Filled 2022-04-01: qty 90, 90d supply, fill #2
  Filled 2022-07-09: qty 90, 90d supply, fill #3

## 2021-07-24 MED ORDER — PANTOPRAZOLE SODIUM 40 MG PO TBEC
DELAYED_RELEASE_TABLET | Freq: Two times a day (BID) | ORAL | 3 refills | Status: DC
  Filled 2021-07-24: qty 180, fill #0
  Filled 2021-08-16: qty 180, 90d supply, fill #0
  Filled 2021-12-18: qty 180, 90d supply, fill #1
  Filled 2022-04-01: qty 180, 90d supply, fill #2
  Filled 2022-07-09: qty 180, 90d supply, fill #3

## 2021-07-24 MED ORDER — ESCITALOPRAM OXALATE 10 MG PO TABS
ORAL_TABLET | Freq: Every day | ORAL | 3 refills | Status: DC
  Filled 2021-07-24: qty 90, fill #0
  Filled 2021-08-16: qty 90, 90d supply, fill #0
  Filled 2021-11-14: qty 90, 90d supply, fill #1
  Filled 2022-02-12: qty 90, 90d supply, fill #2
  Filled 2022-05-06: qty 90, 90d supply, fill #0

## 2021-07-24 MED ORDER — AMLODIPINE BESY-BENAZEPRIL HCL 2.5-10 MG PO CAPS
1.0000 | ORAL_CAPSULE | Freq: Every day | ORAL | 3 refills | Status: DC
  Filled 2021-07-24 – 2021-10-17 (×2): qty 90, 90d supply, fill #0
  Filled 2022-01-14: qty 90, 90d supply, fill #1
  Filled 2022-04-18: qty 90, 90d supply, fill #2
  Filled 2022-07-09: qty 90, 90d supply, fill #3

## 2021-07-24 NOTE — Progress Notes (Signed)
BP 138/84 (BP Location: Left Arm, Patient Position: Sitting, Cuff Size: Large)   Pulse 75   Temp 98.6 F (37 C) (Oral)   Wt 191 lb 1.9 oz (86.7 kg)   BMI 31.80 kg/m    Subjective:    Patient ID: Margaret Stanton, female    DOB: 1961/10/01, 60 y.o.   MRN: [id number]  HPI: Margaret Stanton is a 60 y.o. female presenting on 07/24/2021 for comprehensive medical examination. Current medical complaints include: no  She currently lives with: husband Interim Problems from her last visit: knee surgery in March  She reports regular vision exams q1-5y: yes She reports regular dental exams q 63m: yes Her diet consists of:  weight watchers She endorses exercise and/or activity of: 3 days a week, walking She works at: [employer], [employer] of patient accounting  She denies ETOH use. She denies nictoine use. She denies illegal substance use.    She reports menopause: ablation in 2010 Current menopausal symptoms: no She is currently  sexually active with husband She denies  concerns today about STI Contraception choices are: n/a  She denies concerns about skin changes today. She denies concerns about bowel changes today. She denies concerns about bladder changes today.  Depression Screen done today and results listed below:  Depression screen [facility] 2/9 07/24/2021 07/24/2020 06/30/2019 03/10/2019 03/02/2019  Decreased Interest 0 1 1 0 1  Down, Depressed, Hopeless 0 1 1 1  0  PHQ - 2 Score 0 2 2 1 1   Altered sleeping - 0 0 - -  Tired, decreased energy - 0 0 - -  Change in appetite - 0 1 - -  Feeling bad or failure about yourself  - 0 0 - -  Trouble concentrating - 1 0 - -  Moving slowly or fidgety/restless - 0 0 - -  Suicidal thoughts - 0 0 - -  PHQ-9 Score - 3 3 - -  Difficult doing work/chores - Not difficult at all Not difficult at all - -    She does not have a history of falls.      Past Medical History:  Past Medical History:  Diagnosis Date   Anxiety    Contusion of rib on left side  08/17/2015   CVA (cerebral infarction)    2013   GERD (gastroesophageal reflux disease)    Hyperlipidemia    Hypertension    Uterine fibromyoma     Surgical History:  Past Surgical History:  Procedure Laterality Date   ABLATION  01-2012   uterine   CHOLECYSTECTOMY     CHOLECYSTECTOMY, LAPAROSCOPIC  07-2006    Medications:  Current Outpatient Medications on File Prior to Visit  Medication Sig   amoxicillin (AMOXIL) 500 MG capsule Take 2 capsules by mouth now then take 1 capsule three times daily until gone. (Patient not taking: Reported on 07/24/2021)   amoxicillin (AMOXIL) 500 MG capsule Take 1 capsule by mouth 3 times daily until gone (Patient not taking: Reported on 07/24/2021)   cyclobenzaprine (FLEXERIL) 10 MG tablet Take one tablet by mouth night before dental appt and 1 tablet 1 hour before dental appt. (Patient not taking: Reported on 07/24/2021)   ondansetron (ZOFRAN) 4 MG tablet TAKE ONE TABLET (4 MG DOSE) BY MOUTH EVERY 8 (EIGHT) HOURS AS NEEDED FOR NAUSEA. (Patient not taking: Reported on 07/24/2021)   No current facility-administered medications on file prior to visit.    Allergies:  No Known Allergies  Social History:  Social History   Socioeconomic  History   Marital status: Married    Spouse name: Not on file   Number of children: Not on file   Years of education: Not on file   Highest education level: Not on file  Occupational History   Not on file  Tobacco Use   Smoking status: Never   Smokeless tobacco: Never  Vaping Use   Vaping Use: Never used  Substance and Sexual Activity   Alcohol use: Yes    Comment: Rarely   Drug use: No   Sexual activity: Yes    Partners: Male    Birth control/protection: Post-menopausal  Other Topics Concern   Not on file  Social History Narrative   Not on file   Social Determinants of Health   Financial Resource Strain: Not on file  Food Insecurity: Not on file  Transportation Needs: Not on file  Physical  Activity: Not on file  Stress: Not on file  Social Connections: Not on file  Intimate Partner Violence: Not on file   Social History   Tobacco Use  Smoking Status Never  Smokeless Tobacco Never   Social History   Substance and Sexual Activity  Alcohol Use Yes   Comment: Rarely    Family History:  Family History  Problem Relation Age of Onset   Heart attack Father    Diabetes Mother    Hypertension Other        parents   Colon cancer Neg Hx    Pancreatic cancer Neg Hx    Rectal cancer Neg Hx    Stomach cancer Neg Hx     Past medical history, surgical history, medications, allergies, family history and social history reviewed with patient today and changes made to appropriate areas of the chart.   All ROS negative except what is listed above and in the HPI.      Objective:    BP 138/84 (BP Location: Left Arm, Patient Position: Sitting, Cuff Size: Large)   Pulse 75   Temp 98.6 F (37 C) (Oral)   Wt 191 lb 1.9 oz (86.7 kg)   BMI 31.80 kg/m   Wt Readings from Last 3 Encounters:  07/24/21 191 lb 1.9 oz (86.7 kg)  07/24/20 208 lb 6.4 oz (94.5 kg)  03/20/20 212 lb 1.6 oz (96.2 kg)    Physical Exam Vitals and nursing note reviewed.  Constitutional:      General: She is not in acute distress.    Appearance: Normal appearance. She is normal weight.  HENT:     Head: Normocephalic and atraumatic.     Right Ear: Hearing, tympanic membrane, ear canal and external ear normal.     Left Ear: Hearing, tympanic membrane, ear canal and external ear normal.     Nose: Nose normal. No mucosal edema, congestion or rhinorrhea.     Right Sinus: No maxillary sinus tenderness or frontal sinus tenderness.     Left Sinus: No maxillary sinus tenderness or frontal sinus tenderness.     Mouth/Throat:     Lips: Pink.     Mouth: Mucous membranes are moist.     Tongue: No lesions.     Pharynx: Oropharynx is clear. Uvula midline. No oropharyngeal exudate or posterior oropharyngeal  erythema.     Tonsils: No tonsillar exudate or tonsillar abscesses.  Eyes:     General: Lids are normal. Vision grossly intact.     Extraocular Movements: Extraocular movements intact.     Conjunctiva/sclera: Conjunctivae normal.     Pupils: Pupils are  equal, round, and reactive to light.     Funduscopic exam:    Right eye: Red reflex present.        Left eye: Red reflex present.    Visual Fields: Right eye visual fields normal and left eye visual fields normal.  Neck:     Thyroid: No thyroid mass, thyromegaly or thyroid tenderness.     Vascular: No carotid bruit or JVD.     Trachea: Trachea normal.  Cardiovascular:     Rate and Rhythm: Normal rate and regular rhythm.     Chest Wall: PMI is not displaced.     Pulses: Normal pulses.     Heart sounds: Normal heart sounds. No murmur heard. Pulmonary:     Effort: Pulmonary effort is normal. No respiratory distress.     Breath sounds: Normal breath sounds.  Chest:     Chest wall: No deformity.  Breasts:    Breasts are symmetrical.  Abdominal:     General: Abdomen is flat. Bowel sounds are normal. There is no distension.     Palpations: Abdomen is soft. There is no hepatomegaly, splenomegaly or mass.     Tenderness: There is no abdominal tenderness. There is no right CVA tenderness, left CVA tenderness, guarding or rebound.  Genitourinary:    Vagina: Normal. No tenderness or lesions.     Cervix: No cervical motion tenderness, discharge, friability or lesion.     Uterus: Normal. Not tender.      Adnexa: Right adnexa normal and left adnexa normal.       Right: No mass or tenderness.         Left: No mass or tenderness.    Musculoskeletal:        General: No swelling, tenderness, deformity or signs of injury. Normal range of motion.     Right shoulder: Normal.     Left shoulder: Normal.     Right upper arm: Normal.     Left upper arm: Normal.     Cervical back: Normal, full passive range of motion without pain, normal range of  motion and neck supple. No tenderness.     Thoracic back: Normal.     Lumbar back: Normal.     Right hip: Normal.     Left hip: Normal.     Right upper leg: Normal.     Left upper leg: Normal.     Right knee: Normal.     Left knee: Normal.     Right lower leg: No edema.     Left lower leg: No edema.  Feet:     Right foot:     Skin integrity: Skin integrity normal.     Toenail Condition: Right toenails are normal.     Left foot:     Skin integrity: Skin integrity normal.     Toenail Condition: Left toenails are normal.  Lymphadenopathy:     Head:     Right side of head: No submental or tonsillar adenopathy.     Left side of head: No submental or tonsillar adenopathy.     Cervical: No cervical adenopathy.     Right cervical: No superficial cervical adenopathy.    Left cervical: No superficial cervical adenopathy.     Upper Body:     Right upper body: No supraclavicular adenopathy.     Left upper body: No supraclavicular adenopathy.  Skin:    General: Skin is warm and dry.     Capillary Refill: Capillary refill takes less than  2 seconds.     Findings: No bruising or rash.     Nails: There is no clubbing.  Neurological:     General: No focal deficit present.     Mental Status: She is alert and oriented to person, place, and time.     GCS: GCS eye subscore is 4. GCS verbal subscore is 5. GCS motor subscore is 6.     Cranial Nerves: Cranial nerves are intact.     Sensory: Sensation is intact.     Motor: Motor function is intact.     Coordination: Coordination is intact.     Gait: Gait is intact.  Psychiatric:        Attention and Perception: Attention normal.        Mood and Affect: Mood and affect normal.        Speech: Speech normal.        Behavior: Behavior normal. Behavior is cooperative.        Thought Content: Thought content normal.        Cognition and Memory: Cognition and memory normal.        Judgment: Judgment normal.    Results for orders placed or  performed in visit on 06/28/21  CBC with Differential/Platelet  Result Value Ref Range   WBC 5.4 3.8 - 10.8 Thousand/uL   RBC 3.48 (L) 3.80 - 5.10 Million/uL   Hemoglobin 10.7 (L) 11.7 - 15.5 g/dL   HCT 31.8 (L) 35.0 - 45.0 %   MCV 91.4 80.0 - 100.0 fL   MCH 30.7 27.0 - 33.0 pg   MCHC 33.6 32.0 - 36.0 g/dL   RDW 12.9 11.0 - 15.0 %   Platelets 223 140 - 400 Thousand/uL   MPV 10.6 7.5 - 12.5 fL   Neutro Abs 3,656 1,500 - 7,800 cells/uL   Lymphs Abs 1,318 850 - 3,900 cells/uL   Absolute Monocytes 281 200 - 950 cells/uL   Eosinophils Absolute 108 15 - 500 cells/uL   Basophils Absolute 38 0 - 200 cells/uL   Neutrophils Relative % 67.7 %   Total Lymphocyte 24.4 %   Monocytes Relative 5.2 %   Eosinophils Relative 2.0 %   Basophils Relative 0.7 %  Comprehensive metabolic panel  Result Value Ref Range   Glucose, Bld 85 65 - 99 mg/dL   BUN 14 7 - 25 mg/dL   Creat 0.79 0.50 - 1.03 mg/dL   BUN/Creatinine Ratio NOT APPLICABLE 6 - 22 (calc)   Sodium 141 135 - 146 mmol/L   Potassium 3.6 3.5 - 5.3 mmol/L   Chloride 102 98 - 110 mmol/L   CO2 30 20 - 32 mmol/L   Calcium 8.9 8.6 - 10.4 mg/dL   Total Protein 6.9 6.1 - 8.1 g/dL   Albumin 4.4 3.6 - 5.1 g/dL   Globulin 2.5 1.9 - 3.7 g/dL (calc)   AG Ratio 1.8 1.0 - 2.5 (calc)   Total Bilirubin 0.5 0.2 - 1.2 mg/dL   Alkaline phosphatase (APISO) 103 37 - 153 U/L   AST 24 10 - 35 U/L   ALT 23 6 - 29 U/L  Lipid panel  Result Value Ref Range   Cholesterol 178 <200 mg/dL   HDL 67 > OR = 50 mg/dL   Triglycerides 93 <150 mg/dL   LDL Cholesterol (Calc) 92 mg/dL (calc)   Total CHOL/HDL Ratio 2.7 <5.0 (calc)   Non-HDL Cholesterol (Calc) 111 <130 mg/dL (calc)      Assessment & Plan:   Problem List Items  Addressed This Visit       Cardiovascular and Mediastinum   Essential hypertension, benign (Chronic)   Relevant Medications   amlodipine-benazepril (LOTREL) 2.5-10 MG capsule   aspirin EC 325 MG tablet   atorvastatin (LIPITOR) 40 MG tablet    Other Relevant Orders   TSH     Digestive   GERD (gastroesophageal reflux disease) (Chronic)   Relevant Medications   pantoprazole (PROTONIX) 40 MG tablet     Other   History of CVA (cerebrovascular accident) without residual deficits (Chronic)   Relevant Medications   atorvastatin (LIPITOR) 40 MG tablet   Depression (Chronic)   Relevant Medications   ALPRAZolam (XANAX) 0.5 MG tablet   escitalopram (LEXAPRO) 10 MG tablet   Anemia   Relevant Orders   Iron, TIBC and Ferritin Panel   B12 and Folate Panel   TSH   Other Visit Diagnoses     Need for influenza vaccination    -  Primary   Relevant Orders   Flu Vaccine QUAD 6+ mos PF IM (Fluarix Quad PF) (Completed)   Need for diphtheria-tetanus-pertussis (Tdap) vaccine       Relevant Orders   Tdap vaccine greater than or equal to 7yo IM (Completed)          LABORATORY TESTING:  - Health maintenance labs ordered today as discussed above.           CBC, CMP, LIPIDS - 07/20/21          Adding labs above for anemia workup - STI testing: deferred - Pap smear: up to date    IMMUNIZATIONS:   - Tdap: Tetanus vaccination status reviewed: Td vaccination indicated and given today. - Influenza: Administered today - Pneumovax: Not applicable - Prevnar: Not applicable - HPV: Not applicable - Shingrix vaccine: Refused - COVID-19: Up to date  SCREENING: - Mammogram: Up to date - Bone Density: Not applicable - Colonoscopy: Up to date  Discussed with patient purpose of the colonoscopy is to detect colon cancer at curable precancerous or early stages  - AAA Screening: Not applicable  -Hearing Test: Not applicable  -Spirometry: Not applicable  - Lung Cancer Screening: Not applicable    PATIENT COUNSELING:   Advised to take 1 mg of folate supplement per day if capable of pregnancy.   Sexuality: Discussed sexually transmitted diseases, partner selection, use of condoms, avoidance of unintended pregnancy, and contraceptive  alternatives.    I discussed with the patient that most people either abstain from alcohol or drink within safe limits (<=14/week and <=4 drinks/occasion for males, <=7/weeks and <= 3 drinks/occasion for females) and that the risk for alcohol disorders and other health effects rises proportionally with the number of drinks per week and how often a drinker exceeds daily limits.  Discussed cessation/primary prevention of drug use and availability of treatment for abuse.   Diet: Encouraged to adjust caloric intake to maintain or achieve ideal body weight, to reduce intake of dietary saturated fat and total fat, to limit sodium intake by avoiding high sodium foods and not adding table salt, and to maintain adequate dietary potassium and calcium preferably from fresh fruits, vegetables, and low-fat dairy products. Encouraged vitamin D 1000 units and Calcium 1300mg  or 4 servings of dairy a day.  Emphasized the importance of regular exercise.  Injury prevention: Discussed safety belts, safety helmets, smoke detector, smoking near bedding or upholstery.   Dental health: Discussed importance of regular tooth brushing, flossing, and dental visits.  Follow up plan:  Return  in about 1 year (around 07/24/2022) for [place].  Purcell Nails Olevia Bowens, DNP, FNP-C

## 2021-07-24 NOTE — Patient Instructions (Signed)
So good to meet you today!  Labs ordered - we will let you know results as they come in.  Refills sent in.

## 2021-07-25 LAB — B12 AND FOLATE PANEL
Folate: 11.3 ng/mL
Vitamin B-12: 616 pg/mL (ref 200–1100)

## 2021-07-25 LAB — TSH: TSH: 2.21 mIU/L (ref 0.40–4.50)

## 2021-07-25 LAB — IRON,TIBC AND FERRITIN PANEL
%SAT: 24 % (calc) (ref 16–45)
Ferritin: 55 ng/mL (ref 16–232)
Iron: 75 ug/dL (ref 45–160)
TIBC: 308 mcg/dL (calc) (ref 250–450)

## 2021-08-06 ENCOUNTER — Other Ambulatory Visit (HOSPITAL_BASED_OUTPATIENT_CLINIC_OR_DEPARTMENT_OTHER): Payer: Self-pay

## 2021-08-16 ENCOUNTER — Other Ambulatory Visit (HOSPITAL_BASED_OUTPATIENT_CLINIC_OR_DEPARTMENT_OTHER): Payer: Self-pay

## 2021-10-17 ENCOUNTER — Other Ambulatory Visit (HOSPITAL_BASED_OUTPATIENT_CLINIC_OR_DEPARTMENT_OTHER): Payer: Self-pay

## 2021-11-14 ENCOUNTER — Other Ambulatory Visit (HOSPITAL_BASED_OUTPATIENT_CLINIC_OR_DEPARTMENT_OTHER): Payer: Self-pay

## 2021-12-19 ENCOUNTER — Other Ambulatory Visit (HOSPITAL_BASED_OUTPATIENT_CLINIC_OR_DEPARTMENT_OTHER): Payer: Self-pay

## 2021-12-24 DIAGNOSIS — K219 Gastro-esophageal reflux disease without esophagitis: Secondary | ICD-10-CM | POA: Diagnosis not present

## 2021-12-24 DIAGNOSIS — K6289 Other specified diseases of anus and rectum: Secondary | ICD-10-CM | POA: Diagnosis not present

## 2021-12-27 ENCOUNTER — Other Ambulatory Visit (HOSPITAL_COMMUNITY): Payer: Self-pay

## 2021-12-27 DIAGNOSIS — K6289 Other specified diseases of anus and rectum: Secondary | ICD-10-CM | POA: Diagnosis not present

## 2021-12-27 DIAGNOSIS — K648 Other hemorrhoids: Secondary | ICD-10-CM | POA: Diagnosis not present

## 2021-12-27 MED ORDER — HYDROCORTISONE (PERIANAL) 2.5 % EX CREA
TOPICAL_CREAM | CUTANEOUS | 2 refills | Status: DC
  Filled 2021-12-27: qty 30, 15d supply, fill #0

## 2022-01-01 DIAGNOSIS — L821 Other seborrheic keratosis: Secondary | ICD-10-CM | POA: Diagnosis not present

## 2022-01-01 DIAGNOSIS — L579 Skin changes due to chronic exposure to nonionizing radiation, unspecified: Secondary | ICD-10-CM | POA: Diagnosis not present

## 2022-01-01 DIAGNOSIS — L720 Epidermal cyst: Secondary | ICD-10-CM | POA: Diagnosis not present

## 2022-01-01 DIAGNOSIS — L820 Inflamed seborrheic keratosis: Secondary | ICD-10-CM | POA: Diagnosis not present

## 2022-01-01 DIAGNOSIS — Z1231 Encounter for screening mammogram for malignant neoplasm of breast: Secondary | ICD-10-CM | POA: Diagnosis not present

## 2022-01-01 LAB — HM MAMMOGRAPHY

## 2022-01-04 ENCOUNTER — Other Ambulatory Visit (HOSPITAL_COMMUNITY): Payer: Self-pay

## 2022-01-14 ENCOUNTER — Other Ambulatory Visit (HOSPITAL_BASED_OUTPATIENT_CLINIC_OR_DEPARTMENT_OTHER): Payer: Self-pay

## 2022-02-12 ENCOUNTER — Other Ambulatory Visit (HOSPITAL_BASED_OUTPATIENT_CLINIC_OR_DEPARTMENT_OTHER): Payer: Self-pay

## 2022-04-01 ENCOUNTER — Other Ambulatory Visit (HOSPITAL_BASED_OUTPATIENT_CLINIC_OR_DEPARTMENT_OTHER): Payer: Self-pay

## 2022-04-19 ENCOUNTER — Other Ambulatory Visit (HOSPITAL_BASED_OUTPATIENT_CLINIC_OR_DEPARTMENT_OTHER): Payer: Self-pay

## 2022-05-06 ENCOUNTER — Other Ambulatory Visit (HOSPITAL_COMMUNITY): Payer: Self-pay

## 2022-06-14 DIAGNOSIS — H35433 Paving stone degeneration of retina, bilateral: Secondary | ICD-10-CM | POA: Diagnosis not present

## 2022-06-14 DIAGNOSIS — H1045 Other chronic allergic conjunctivitis: Secondary | ICD-10-CM | POA: Diagnosis not present

## 2022-06-14 DIAGNOSIS — H11153 Pinguecula, bilateral: Secondary | ICD-10-CM | POA: Diagnosis not present

## 2022-06-14 DIAGNOSIS — H168 Other keratitis: Secondary | ICD-10-CM | POA: Diagnosis not present

## 2022-06-14 DIAGNOSIS — H2513 Age-related nuclear cataract, bilateral: Secondary | ICD-10-CM | POA: Diagnosis not present

## 2022-06-18 ENCOUNTER — Encounter: Payer: Self-pay | Admitting: Medical-Surgical

## 2022-06-18 ENCOUNTER — Other Ambulatory Visit: Payer: Self-pay | Admitting: Medical-Surgical

## 2022-06-18 DIAGNOSIS — E785 Hyperlipidemia, unspecified: Secondary | ICD-10-CM

## 2022-06-18 DIAGNOSIS — D649 Anemia, unspecified: Secondary | ICD-10-CM

## 2022-06-18 DIAGNOSIS — Z0000 Encounter for general adult medical examination without abnormal findings: Secondary | ICD-10-CM

## 2022-06-18 DIAGNOSIS — I10 Essential (primary) hypertension: Secondary | ICD-10-CM

## 2022-06-18 NOTE — Telephone Encounter (Signed)
Are there any other labs that you would like for the pt to have?  Charyl Bigger, CMA

## 2022-07-09 ENCOUNTER — Other Ambulatory Visit (HOSPITAL_COMMUNITY): Payer: Self-pay

## 2022-07-10 ENCOUNTER — Other Ambulatory Visit (HOSPITAL_COMMUNITY): Payer: Self-pay

## 2022-07-11 ENCOUNTER — Other Ambulatory Visit (HOSPITAL_COMMUNITY): Payer: Self-pay

## 2022-07-25 ENCOUNTER — Ambulatory Visit (INDEPENDENT_AMBULATORY_CARE_PROVIDER_SITE_OTHER): Payer: 59 | Admitting: Medical-Surgical

## 2022-07-25 ENCOUNTER — Other Ambulatory Visit (HOSPITAL_COMMUNITY): Payer: Self-pay

## 2022-07-25 ENCOUNTER — Encounter: Payer: Self-pay | Admitting: Medical-Surgical

## 2022-07-25 VITALS — BP 131/87 | HR 91 | Ht 65.0 in | Wt 208.1 lb

## 2022-07-25 DIAGNOSIS — F3342 Major depressive disorder, recurrent, in full remission: Secondary | ICD-10-CM

## 2022-07-25 DIAGNOSIS — Z23 Encounter for immunization: Secondary | ICD-10-CM | POA: Diagnosis not present

## 2022-07-25 DIAGNOSIS — E785 Hyperlipidemia, unspecified: Secondary | ICD-10-CM | POA: Diagnosis not present

## 2022-07-25 DIAGNOSIS — Z7689 Persons encountering health services in other specified circumstances: Secondary | ICD-10-CM

## 2022-07-25 DIAGNOSIS — K2100 Gastro-esophageal reflux disease with esophagitis, without bleeding: Secondary | ICD-10-CM

## 2022-07-25 DIAGNOSIS — Z8673 Personal history of transient ischemic attack (TIA), and cerebral infarction without residual deficits: Secondary | ICD-10-CM

## 2022-07-25 DIAGNOSIS — I10 Essential (primary) hypertension: Secondary | ICD-10-CM | POA: Diagnosis not present

## 2022-07-25 DIAGNOSIS — Z0000 Encounter for general adult medical examination without abnormal findings: Secondary | ICD-10-CM | POA: Diagnosis not present

## 2022-07-25 MED ORDER — AMLODIPINE BESY-BENAZEPRIL HCL 2.5-10 MG PO CAPS
1.0000 | ORAL_CAPSULE | Freq: Every day | ORAL | 3 refills | Status: DC
  Filled 2022-07-25 – 2022-10-03 (×2): qty 90, 90d supply, fill #0
  Filled 2023-01-13: qty 90, 90d supply, fill #1
  Filled 2023-04-06: qty 90, 90d supply, fill #2
  Filled 2023-07-06: qty 90, 90d supply, fill #3

## 2022-07-25 MED ORDER — ATORVASTATIN CALCIUM 40 MG PO TABS
40.0000 mg | ORAL_TABLET | Freq: Every day | ORAL | 3 refills | Status: DC
  Filled 2022-07-25: qty 90, fill #0
  Filled 2022-10-03: qty 90, 90d supply, fill #0
  Filled 2023-01-13: qty 90, 90d supply, fill #1
  Filled 2023-04-06: qty 90, 90d supply, fill #2
  Filled 2023-07-06: qty 90, 90d supply, fill #3

## 2022-07-25 MED ORDER — ALPRAZOLAM 0.5 MG PO TABS
0.2500 mg | ORAL_TABLET | Freq: Two times a day (BID) | ORAL | 0 refills | Status: DC | PRN
  Filled 2022-07-25: qty 30, 15d supply, fill #0

## 2022-07-25 MED ORDER — ESCITALOPRAM OXALATE 10 MG PO TABS
10.0000 mg | ORAL_TABLET | Freq: Every day | ORAL | 3 refills | Status: DC
  Filled 2022-07-25: qty 90, 90d supply, fill #0
  Filled 2022-10-03 – 2022-10-31 (×2): qty 90, 90d supply, fill #1
  Filled 2023-01-27: qty 90, 90d supply, fill #2
  Filled 2023-04-24: qty 90, 90d supply, fill #3

## 2022-07-25 MED ORDER — PANTOPRAZOLE SODIUM 40 MG PO TBEC
40.0000 mg | DELAYED_RELEASE_TABLET | Freq: Two times a day (BID) | ORAL | 3 refills | Status: DC
  Filled 2022-07-25: qty 180, fill #0
  Filled 2022-10-03: qty 180, 90d supply, fill #0
  Filled 2023-01-13: qty 180, 90d supply, fill #1
  Filled 2023-04-09: qty 180, 90d supply, fill #2
  Filled 2023-07-06: qty 180, 90d supply, fill #3

## 2022-07-25 NOTE — Progress Notes (Signed)
Complete physical exam  Patient: Margaret Stanton   DOB: [birth year]   61 y.o. Female  MRN: [id number]  Subjective:    Chief Complaint  Patient presents with   Annual Exam   Margaret Stanton is a 61 y.o. female who presents today for a complete physical exam. She reports consuming a general diet.  Doing yoga twice weekly for 2 months and loves it.  She generally feels well. She reports sleeping well. She does not have additional problems to discuss today.    Most recent fall risk assessment:    07/24/2021    9:28 AM  [place] in the past year? 0  Number falls in past yr: 0  Injury with Fall? 0  Risk for fall due to : No Fall Risks  Follow up Falls evaluation completed     Most recent depression screenings:    07/24/2021    9:28 AM 07/24/2020    8:23 AM  PHQ 2/9 Scores  PHQ - 2 Score 0 2  PHQ- 9 Score  3   Vision:, Dental: , and STD:     Patient Care Team: Samuel Bouche, NP as PCP - General (Nurse Practitioner)   Outpatient Medications Prior to Visit  Medication Sig   [DISCONTINUED] ALPRAZolam (XANAX) 0.5 MG tablet Take 1/2-1 tablet (0.25-0.5 mg total) by mouth 2 (two) times daily as needed for anxiety.   [DISCONTINUED] amlodipine-benazepril (LOTREL) 2.5-10 MG capsule TAKE 1 CAPSULE BY MOUTH DAILY.   [DISCONTINUED] atorvastatin (LIPITOR) 40 MG tablet TAKE 1 TABLET BY MOUTH ONCE DAILY   [DISCONTINUED] escitalopram (LEXAPRO) 10 MG tablet TAKE 1 TABLET BY MOUTH ONCE DAILY   [DISCONTINUED] pantoprazole (PROTONIX) 40 MG tablet TAKE 1 TABLET BY MOUTH TWICE DAILY   [DISCONTINUED] amoxicillin (AMOXIL) 500 MG capsule Take 2 capsules by mouth now then take 1 capsule three times daily until gone. (Patient not taking: Reported on 07/24/2021)   [DISCONTINUED] amoxicillin (AMOXIL) 500 MG capsule Take 1 capsule by mouth 3 times daily until gone (Patient not taking: Reported on 07/24/2021)   [DISCONTINUED] cyclobenzaprine (FLEXERIL) 10 MG tablet Take one tablet by mouth night before  dental appt and 1 tablet 1 hour before dental appt. (Patient not taking: Reported on 07/24/2021)   [DISCONTINUED] hydrocortisone (ANUSOL-HC) 2.5 % rectal cream Place one application rectally 2 (two) times daily. (Patient not taking: Reported on 07/25/2022)   No facility-administered medications prior to visit.    Review of Systems  Constitutional:  Negative for chills, fever, malaise/fatigue and weight loss.  HENT:  Negative for congestion, ear pain, hearing loss, sinus pain and sore throat.   Eyes:  Negative for blurred vision, photophobia and pain.  Respiratory:  Negative for cough, shortness of breath and wheezing.   Cardiovascular:  Negative for chest pain, palpitations and leg swelling.  Gastrointestinal:  Negative for abdominal pain, constipation, diarrhea, heartburn, nausea and vomiting.  Genitourinary:  Negative for dysuria, frequency and urgency.  Musculoskeletal:  Positive for back pain (mild, intermittent). Negative for falls and neck pain.  Skin:  Negative for itching and rash.  Neurological:  Negative for dizziness, weakness and headaches.  Endo/Heme/Allergies:  Negative for polydipsia. Does not bruise/bleed easily.  Psychiatric/Behavioral:  Negative for depression, substance abuse and suicidal ideas. The patient is not nervous/anxious and does not have insomnia.      Objective:    BP 131/87 (BP Location: Right Arm, Cuff Size: Normal)   Pulse 91   Ht '5\' 5"'$  (1.651 m)   Wt  208 lb 1.6 oz (94.4 kg)   SpO2 97%   BMI 34.63 kg/m    Physical Exam Vitals reviewed.  Constitutional:      General: She is not in acute distress.    Appearance: Normal appearance. She is not ill-appearing.  HENT:     Head: Normocephalic and atraumatic.     Right Ear: Tympanic membrane, ear canal and external ear normal. There is no impacted cerumen.     Left Ear: Tympanic membrane, ear canal and external ear normal. There is no impacted cerumen.     Nose: Nose normal.     Mouth/Throat:     Mouth:  Mucous membranes are moist.     Pharynx: No oropharyngeal exudate or posterior oropharyngeal erythema.  Eyes:     General: No scleral icterus.       Right eye: No discharge.        Left eye: No discharge.     Extraocular Movements: Extraocular movements intact.     Conjunctiva/sclera: Conjunctivae normal.     Pupils: Pupils are equal, round, and reactive to light.  Neck:     Thyroid: No thyromegaly.     Vascular: No carotid bruit or JVD.     Trachea: Trachea normal.  Cardiovascular:     Rate and Rhythm: Normal rate and regular rhythm.     Pulses: Normal pulses.     Heart sounds: Normal heart sounds. No murmur heard.    No friction rub. No gallop.  Pulmonary:     Effort: Pulmonary effort is normal. No respiratory distress.     Breath sounds: Normal breath sounds. No wheezing.  Abdominal:     General: Bowel sounds are normal. There is no distension.     Palpations: Abdomen is soft.     Tenderness: There is no abdominal tenderness. There is no guarding.  Musculoskeletal:        General: Normal range of motion.     Cervical back: Normal range of motion and neck supple.  Skin:    General: Skin is warm and dry.  Neurological:     Mental Status: She is alert and oriented to person, place, and time.     Cranial Nerves: No cranial nerve deficit.  Psychiatric:        Mood and Affect: Mood normal.        Behavior: Behavior normal.        Thought Content: Thought content normal.        Judgment: Judgment normal.   No results found for any visits on 07/25/22.     Assessment & Plan:    Routine Health Maintenance and Physical Exam  Immunization History  Administered Date(s) Administered   Influenza,inj,Quad PF,6+ Mos 07/08/2018, 06/30/2019, 07/24/2020, 07/24/2021, 07/25/2022   Influenza-Unspecified 08/06/2016, 07/08/2018, 06/30/2019, 07/24/2020   PFIZER(Purple Top)SARS-COV-2 Vaccination 11/24/2019, 12/15/2019   Td 01/23/2011   Tdap 07/24/2021   Zoster Recombinat (Shingrix)  07/08/2018    Health Maintenance  Topic Date Due   COVID-19 Vaccine (3 - Pfizer risk series) 08/09/2022 (Originally 01/12/2020)   MAMMOGRAM  01/02/2023   PAP SMEAR-Modifier  11/18/2023   COLONOSCOPY (Pts 45-75yr Insurance coverage will need to be confirmed)  12/23/2023   TETANUS/TDAP  07/25/2031   INFLUENZA VACCINE  Completed   Hepatitis C Screening  Completed   HPV VACCINES  Aged Out   HIV Screening  Discontinued   Zoster Vaccines- Shingrix  Discontinued    Discussed health benefits of physical activity, and encouraged her to engage in  regular exercise appropriate for her age and condition.  1. Encounter to establish care Reviewed available information and discussed care concerns with patient.   2. Annual physical exam Previous orders entered to check CBC, CMP, and lipid panel.  Requisition printed for collection today.  3. Essential hypertension, benign Blood pressure is at goal.  Home readings also at goal.  Continue amlodipine-benazepril as prescribed.  Recommend low-sodium diet and continuing regular intentional activity.  Continue monitoring blood pressure at home with goal of 130/80 or less.  If consistently higher, return for further evaluation and medication management.  4. Hyperlipidemia LDL goal <100 Continue atorvastatin 40 mg daily.  Checking lipid panel.  5. Recurrent major depressive disorder, in full remission ([place]) Stable.  Continue Lexapro 10 mg daily.  She is coming up on the anniversary of her mother's death and is understandably tearful at times.  Responsibly uses Xanax on a very seldom basis so we will go ahead and refill this prescription today since hers have now expired at home.  6. History of CVA (cerebrovascular accident) without residual deficits Continues to take aspirin 325 mg daily.  No residual.  7. Gastroesophageal reflux disease with esophagitis without hemorrhage Stable.  Continue Protonix 40 mg twice daily prn.  If having a flare, consider a  second dose of Protonix in the afternoon or try using famotidine 20 mg at bedtime see if this is helpful.  Would like to use the least effective dose to reduce the risk of osteoporosis and GI infections.  8. Need for shingles vaccine Declined today.  Reports that she had a bad reaction to the initial vaccine in does not want to take the second shot in the series.  9. Need for influenza vaccination Flu vaccine given in office today. - Flu Vaccine QUAD 6+ mos PF IM (Fluarix Quad PF)  Return in about 1 year (around 07/26/2023) for annual physical exam or sooner if needed.   Samuel Bouche, NP

## 2022-07-26 LAB — COMPLETE METABOLIC PANEL WITH GFR
AG Ratio: 1.8 (calc) (ref 1.0–2.5)
ALT: 18 U/L (ref 6–29)
AST: 18 U/L (ref 10–35)
Albumin: 4.6 g/dL (ref 3.6–5.1)
Alkaline phosphatase (APISO): 112 U/L (ref 37–153)
BUN: 16 mg/dL (ref 7–25)
CO2: 30 mmol/L (ref 20–32)
Calcium: 9 mg/dL (ref 8.6–10.4)
Chloride: 103 mmol/L (ref 98–110)
Creat: 0.79 mg/dL (ref 0.50–1.03)
Globulin: 2.6 g/dL (calc) (ref 1.9–3.7)
Glucose, Bld: 87 mg/dL (ref 65–99)
Potassium: 4.1 mmol/L (ref 3.5–5.3)
Sodium: 141 mmol/L (ref 135–146)
Total Bilirubin: 0.6 mg/dL (ref 0.2–1.2)
Total Protein: 7.2 g/dL (ref 6.1–8.1)
eGFR: 86 mL/min/{1.73_m2} (ref >=60)

## 2022-07-26 LAB — CBC WITH DIFFERENTIAL/PLATELET
Absolute Monocytes: 294 cells/uL (ref 200–950)
Basophils Absolute: 42 cells/uL (ref 0–200)
Basophils Relative: 0.7 %
Eosinophils Absolute: 90 cells/uL (ref 15–500)
Eosinophils Relative: 1.5 %
HCT: 31.1 % — ABNORMAL LOW (ref 35.0–45.0)
Hemoglobin: 10.5 g/dL — ABNORMAL LOW (ref 11.7–15.5)
Lymphs Abs: 1560 cells/uL (ref 850–3900)
MCH: 30.8 pg (ref 27.0–33.0)
MCHC: 33.8 g/dL (ref 32.0–36.0)
MCV: 91.2 fL (ref 80.0–100.0)
MPV: 10.5 fL (ref 7.5–12.5)
Monocytes Relative: 4.9 %
Neutro Abs: 4014 cells/uL (ref 1500–7800)
Neutrophils Relative %: 66.9 %
Platelets: 235 10*3/uL (ref 140–400)
RBC: 3.41 10*6/uL — ABNORMAL LOW (ref 3.80–5.10)
RDW: 13 % (ref 11.0–15.0)
Total Lymphocyte: 26 %
WBC: 6 10*3/uL (ref 3.8–10.8)

## 2022-07-26 LAB — LIPID PANEL
Cholesterol: 159 mg/dL (ref <200)
HDL: 72 mg/dL (ref >=50)
LDL Cholesterol (Calc): 74 mg/dL (calc)
Non-HDL Cholesterol (Calc): 87 mg/dL (calc) (ref <130)
Total CHOL/HDL Ratio: 2.2 (calc) (ref <5.0)
Triglycerides: 54 mg/dL (ref <150)

## 2022-10-04 ENCOUNTER — Other Ambulatory Visit (HOSPITAL_COMMUNITY): Payer: Self-pay

## 2022-10-07 ENCOUNTER — Other Ambulatory Visit (HOSPITAL_COMMUNITY): Payer: Self-pay

## 2022-10-31 ENCOUNTER — Other Ambulatory Visit (HOSPITAL_COMMUNITY): Payer: Self-pay

## 2023-01-13 ENCOUNTER — Other Ambulatory Visit (HOSPITAL_COMMUNITY): Payer: Self-pay

## 2023-01-13 ENCOUNTER — Other Ambulatory Visit: Payer: Self-pay

## 2023-01-14 ENCOUNTER — Other Ambulatory Visit: Payer: Self-pay

## 2023-01-27 ENCOUNTER — Other Ambulatory Visit: Payer: Self-pay

## 2023-02-05 DIAGNOSIS — H168 Other keratitis: Secondary | ICD-10-CM | POA: Diagnosis not present

## 2023-02-05 DIAGNOSIS — H35433 Paving stone degeneration of retina, bilateral: Secondary | ICD-10-CM | POA: Diagnosis not present

## 2023-02-05 DIAGNOSIS — H0011 Chalazion right upper eyelid: Secondary | ICD-10-CM | POA: Diagnosis not present

## 2023-02-05 DIAGNOSIS — H1045 Other chronic allergic conjunctivitis: Secondary | ICD-10-CM | POA: Diagnosis not present

## 2023-02-05 DIAGNOSIS — H1033 Unspecified acute conjunctivitis, bilateral: Secondary | ICD-10-CM | POA: Diagnosis not present

## 2023-02-05 DIAGNOSIS — H2513 Age-related nuclear cataract, bilateral: Secondary | ICD-10-CM | POA: Diagnosis not present

## 2023-02-05 DIAGNOSIS — H11153 Pinguecula, bilateral: Secondary | ICD-10-CM | POA: Diagnosis not present

## 2023-02-20 DIAGNOSIS — R1012 Left upper quadrant pain: Secondary | ICD-10-CM | POA: Diagnosis not present

## 2023-02-20 DIAGNOSIS — K760 Fatty (change of) liver, not elsewhere classified: Secondary | ICD-10-CM | POA: Diagnosis not present

## 2023-02-20 DIAGNOSIS — D1803 Hemangioma of intra-abdominal structures: Secondary | ICD-10-CM | POA: Diagnosis not present

## 2023-02-20 DIAGNOSIS — K753 Granulomatous hepatitis, not elsewhere classified: Secondary | ICD-10-CM | POA: Diagnosis not present

## 2023-02-28 ENCOUNTER — Other Ambulatory Visit (HOSPITAL_COMMUNITY): Payer: Self-pay

## 2023-02-28 ENCOUNTER — Encounter: Payer: Self-pay | Admitting: Medical-Surgical

## 2023-02-28 DIAGNOSIS — H0011 Chalazion right upper eyelid: Secondary | ICD-10-CM | POA: Diagnosis not present

## 2023-02-28 MED ORDER — NEOMYCIN-POLYMYXIN-DEXAMETH 3.5-10000-0.1 OP OINT
TOPICAL_OINTMENT | OPHTHALMIC | 0 refills | Status: AC
Start: 2023-02-28 — End: ?
  Filled 2023-02-28: qty 3.5, 7d supply, fill #0

## 2023-03-05 ENCOUNTER — Ambulatory Visit (INDEPENDENT_AMBULATORY_CARE_PROVIDER_SITE_OTHER): Payer: Commercial Managed Care - PPO

## 2023-03-05 ENCOUNTER — Ambulatory Visit: Payer: Commercial Managed Care - PPO | Admitting: Medical-Surgical

## 2023-03-05 ENCOUNTER — Encounter: Payer: Self-pay | Admitting: Medical-Surgical

## 2023-03-05 VITALS — BP 111/76 | HR 87 | Resp 20 | Ht 65.0 in | Wt 194.0 lb

## 2023-03-05 DIAGNOSIS — I517 Cardiomegaly: Secondary | ICD-10-CM | POA: Diagnosis not present

## 2023-03-05 DIAGNOSIS — Z0389 Encounter for observation for other suspected diseases and conditions ruled out: Secondary | ICD-10-CM | POA: Diagnosis not present

## 2023-03-05 NOTE — Progress Notes (Signed)
        Established patient visit  History, exam, impression, and plan:  1. Cardiomegaly Pleasant 62 year old female presenting today to review recent CT results.  She was seen by her GI provider and underwent a CT abdomen/pelvis.  She was diagnosed with diverticulitis and treated appropriately.  After reviewing the CT report, she became worried because she noted that cardiomegaly was seen.  She has no history of this and was very concerned.  History of hypertension that is currently well-controlled.  Her last echocardiogram was several years ago.  She has not had an EKG recently.  After reading the report, she admits that she went on to [employer] and started looking up information.  She got pretty anxious after her research and admits that she is a bit of a hypochondriac.  Since then, she thinks that she may have had some palpitations but admits these are likely just anxiety.  Denies CP, SOB, LE edema, HA, dizziness, and unusual headaches.  On exam, HRR, S1/S2 normal.  Radial and DP pulses 2+, equal.  No peripheral edema.  Lungs CTA, respirations even and unlabored.  EKG performed in office showing normal sinus rhythm, rate 69, normal axis.  Ordering chest x-ray today.  Updating echocardiogram.  Blood pressure is well-controlled today so no need for acute intervention.  Advised patient to monitor for concerning symptoms and seek urgent care/ED evaluation as needed. - DG Chest 2 View; Future - EKG 12-Lead - ECHOCARDIOGRAM COMPLETE; Future  Procedures performed this visit: None.  Return if symptoms worsen or fail to improve.  __________________________________ Thayer Ohm, DNP, APRN, FNP-BC Primary Care and Sports Medicine [facility] [place]

## 2023-03-28 DIAGNOSIS — H0011 Chalazion right upper eyelid: Secondary | ICD-10-CM | POA: Diagnosis not present

## 2023-04-07 ENCOUNTER — Other Ambulatory Visit: Payer: Self-pay

## 2023-04-07 ENCOUNTER — Other Ambulatory Visit (HOSPITAL_COMMUNITY): Payer: Self-pay

## 2023-04-09 ENCOUNTER — Other Ambulatory Visit (HOSPITAL_COMMUNITY): Payer: Self-pay

## 2023-04-22 ENCOUNTER — Ambulatory Visit (HOSPITAL_COMMUNITY): Payer: Commercial Managed Care - PPO | Attending: Cardiovascular Disease

## 2023-04-22 DIAGNOSIS — I517 Cardiomegaly: Secondary | ICD-10-CM | POA: Insufficient documentation

## 2023-04-22 LAB — ECHOCARDIOGRAM COMPLETE
Area-P 1/2: 3.02 cm2
P 1/2 time: 670 msec
S' Lateral: 2.9 cm

## 2023-04-23 ENCOUNTER — Encounter: Payer: Self-pay | Admitting: Medical-Surgical

## 2023-04-23 NOTE — Telephone Encounter (Signed)
I have never seen this patient

## 2023-04-24 ENCOUNTER — Other Ambulatory Visit: Payer: Self-pay

## 2023-06-04 DIAGNOSIS — I119 Hypertensive heart disease without heart failure: Secondary | ICD-10-CM | POA: Insufficient documentation

## 2023-06-09 DIAGNOSIS — E785 Hyperlipidemia, unspecified: Secondary | ICD-10-CM | POA: Diagnosis not present

## 2023-06-09 DIAGNOSIS — I10 Essential (primary) hypertension: Secondary | ICD-10-CM | POA: Diagnosis not present

## 2023-06-09 DIAGNOSIS — Z1331 Encounter for screening for depression: Secondary | ICD-10-CM | POA: Diagnosis not present

## 2023-06-09 DIAGNOSIS — I119 Hypertensive heart disease without heart failure: Secondary | ICD-10-CM | POA: Diagnosis not present

## 2023-06-09 DIAGNOSIS — D3502 Benign neoplasm of left adrenal gland: Secondary | ICD-10-CM | POA: Diagnosis not present

## 2023-07-06 ENCOUNTER — Other Ambulatory Visit (HOSPITAL_COMMUNITY): Payer: Self-pay

## 2023-07-07 ENCOUNTER — Other Ambulatory Visit: Payer: Self-pay

## 2023-07-11 ENCOUNTER — Telehealth: Payer: Self-pay

## 2023-07-11 DIAGNOSIS — Z0000 Encounter for general adult medical examination without abnormal findings: Secondary | ICD-10-CM

## 2023-07-11 DIAGNOSIS — E785 Hyperlipidemia, unspecified: Secondary | ICD-10-CM

## 2023-07-11 NOTE — Telephone Encounter (Signed)
Patient is scheduled for 07-29-23 for physical, ok to put in lab orders?

## 2023-07-11 NOTE — Telephone Encounter (Signed)
Basic lab orders placed for her upcoming physical.  She should have these done fasting at any [facility] location.  ___________________________________________ Thayer Ohm, DNP, APRN, FNP-BC Primary Care and Sports Medicine [facility] [place]

## 2023-07-29 ENCOUNTER — Encounter: Payer: Self-pay | Admitting: Medical-Surgical

## 2023-07-29 ENCOUNTER — Other Ambulatory Visit (HOSPITAL_COMMUNITY): Payer: Self-pay

## 2023-07-29 ENCOUNTER — Ambulatory Visit (INDEPENDENT_AMBULATORY_CARE_PROVIDER_SITE_OTHER): Payer: Commercial Managed Care - PPO | Admitting: Medical-Surgical

## 2023-07-29 VITALS — BP 118/77 | HR 84 | Resp 20 | Ht 65.0 in | Wt 205.7 lb

## 2023-07-29 DIAGNOSIS — E785 Hyperlipidemia, unspecified: Secondary | ICD-10-CM

## 2023-07-29 DIAGNOSIS — I10 Essential (primary) hypertension: Secondary | ICD-10-CM

## 2023-07-29 DIAGNOSIS — Z1231 Encounter for screening mammogram for malignant neoplasm of breast: Secondary | ICD-10-CM

## 2023-07-29 DIAGNOSIS — Z1329 Encounter for screening for other suspected endocrine disorder: Secondary | ICD-10-CM | POA: Diagnosis not present

## 2023-07-29 DIAGNOSIS — F3342 Major depressive disorder, recurrent, in full remission: Secondary | ICD-10-CM | POA: Diagnosis not present

## 2023-07-29 DIAGNOSIS — Z0000 Encounter for general adult medical examination without abnormal findings: Secondary | ICD-10-CM

## 2023-07-29 DIAGNOSIS — Z8673 Personal history of transient ischemic attack (TIA), and cerebral infarction without residual deficits: Secondary | ICD-10-CM

## 2023-07-29 DIAGNOSIS — K219 Gastro-esophageal reflux disease without esophagitis: Secondary | ICD-10-CM | POA: Diagnosis not present

## 2023-07-29 DIAGNOSIS — Z23 Encounter for immunization: Secondary | ICD-10-CM | POA: Diagnosis not present

## 2023-07-29 DIAGNOSIS — Z833 Family history of diabetes mellitus: Secondary | ICD-10-CM | POA: Diagnosis not present

## 2023-07-29 MED ORDER — ALPRAZOLAM 0.5 MG PO TABS
0.2500 mg | ORAL_TABLET | Freq: Two times a day (BID) | ORAL | 0 refills | Status: DC | PRN
  Filled 2023-07-29: qty 30, 15d supply, fill #0

## 2023-07-29 MED ORDER — AMLODIPINE BESY-BENAZEPRIL HCL 2.5-10 MG PO CAPS
1.0000 | ORAL_CAPSULE | Freq: Every day | ORAL | 3 refills | Status: DC
  Filled 2023-07-29 – 2023-10-12 (×2): qty 90, 90d supply, fill #0
  Filled 2024-01-12: qty 90, 90d supply, fill #1
  Filled 2024-03-29: qty 90, 90d supply, fill #2
  Filled 2024-07-08: qty 90, 90d supply, fill #3

## 2023-07-29 MED ORDER — ESCITALOPRAM OXALATE 10 MG PO TABS
10.0000 mg | ORAL_TABLET | Freq: Every day | ORAL | 3 refills | Status: DC
  Filled 2023-07-29: qty 90, 90d supply, fill #0
  Filled 2023-10-23: qty 90, 90d supply, fill #1
  Filled 2024-01-21: qty 90, 90d supply, fill #2
  Filled 2024-04-20: qty 90, 90d supply, fill #3

## 2023-07-29 MED ORDER — PANTOPRAZOLE SODIUM 40 MG PO TBEC
40.0000 mg | DELAYED_RELEASE_TABLET | Freq: Two times a day (BID) | ORAL | 3 refills | Status: DC
  Filled 2023-07-29 – 2023-10-12 (×3): qty 180, 90d supply, fill #0
  Filled 2024-01-12: qty 180, 90d supply, fill #1
  Filled 2024-03-29: qty 180, 90d supply, fill #2
  Filled 2024-07-08: qty 180, 90d supply, fill #3

## 2023-07-29 MED ORDER — ATORVASTATIN CALCIUM 40 MG PO TABS
40.0000 mg | ORAL_TABLET | Freq: Every day | ORAL | 3 refills | Status: DC
  Filled 2023-07-29 – 2023-10-12 (×2): qty 90, 90d supply, fill #0
  Filled 2024-01-12: qty 90, 90d supply, fill #1
  Filled 2024-03-29: qty 90, 90d supply, fill #2
  Filled 2024-07-08: qty 90, 90d supply, fill #3

## 2023-07-29 NOTE — Progress Notes (Signed)
Complete physical exam  Patient: Margaret Stanton   DOB: 12-23-1960   62 y.o. Female  MRN: [id number]  Subjective:    Chief Complaint  Patient presents with   Annual Exam   Margaret Stanton is a 62 y.o. female who presents today for a complete physical exam. She reports consuming a general diet.  Walking for exercise.  She generally feels well. She reports sleeping well. She does not have additional problems to discuss today.    Most recent fall risk assessment:    03/05/2023    8:40 AM  Fall Risk   Falls in the past year? 0  Number falls in past yr: 1  Injury with Fall? 0  Risk for fall due to : History of fall(s)  Follow up Falls evaluation completed     Most recent depression screenings:    03/05/2023    8:40 AM 07/25/2022    9:33 AM  PHQ 2/9 Scores  PHQ - 2 Score 0 0  PHQ- 9 Score  0    Vision:Within last year, Dental: No current dental problems and Receives regular dental care, and STD: The patient denies history of sexually transmitted disease.    Patient Care Team: Christen Butter, NP as PCP - General (Nurse Practitioner)   Outpatient Medications Prior to Visit  Medication Sig   ALPRAZolam (XANAX) 0.5 MG tablet Take 1/2-1 tablet (0.25-0.5 mg total) by mouth 2 (two) times daily as needed for anxiety.   amlodipine-benazepril (LOTREL) 2.5-10 MG capsule Take 1 capsule by mouth daily.   atorvastatin (LIPITOR) 40 MG tablet Take 1 tablet (40 mg total) by mouth daily.   escitalopram (LEXAPRO) 10 MG tablet Take 1 tablet (10 mg total) by mouth daily.   neomycin-polymyxin b-dexamethasone (MAXITROL) 3.5-10000-0.1 OINT Apply 0.5 inches (0.5 g total) to right eye at bedtime for 7 days.   pantoprazole (PROTONIX) 40 MG tablet Take 1 tablet (40 mg total) by mouth 2 (two) times daily.   No facility-administered medications prior to visit.    Review of Systems  Constitutional:  Negative for chills, fever, malaise/fatigue and weight loss.  HENT:  Negative for congestion, ear pain,  hearing loss, sinus pain and sore throat.   Eyes:  Negative for blurred vision, photophobia and pain.  Respiratory:  Negative for cough, shortness of breath and wheezing.   Cardiovascular:  Negative for chest pain, palpitations and leg swelling.  Gastrointestinal:  Negative for abdominal pain, constipation, diarrhea, heartburn, nausea and vomiting.  Genitourinary:  Negative for dysuria, frequency and urgency.  Musculoskeletal:  Positive for myalgias (right forearm). Negative for falls and neck pain.  Skin:  Negative for itching and rash.  Neurological:  Negative for dizziness, weakness and headaches.  Endo/Heme/Allergies:  Negative for polydipsia. Does not bruise/bleed easily.  Psychiatric/Behavioral:  Negative for depression, substance abuse and suicidal ideas. The patient is not nervous/anxious and does not have insomnia.      Objective:    BP 118/77 (BP Location: Left Arm, Cuff Size: Normal)   Pulse 84   Resp 20   Ht 5\' 5"  (1.651 m)   Wt 205 lb 11.2 oz (93.3 kg)   SpO2 98%   BMI 34.23 kg/m    Physical Exam Vitals reviewed.  Constitutional:      General: She is not in acute distress.    Appearance: Normal appearance. She is not ill-appearing.  HENT:     Head: Normocephalic and atraumatic.     Right Ear: Tympanic membrane, ear canal and external  ear normal. There is no impacted cerumen.     Left Ear: Tympanic membrane, ear canal and external ear normal. There is no impacted cerumen.     Nose: Nose normal. No congestion or rhinorrhea.     Mouth/Throat:     Mouth: Mucous membranes are moist.     Pharynx: No oropharyngeal exudate or posterior oropharyngeal erythema.  Eyes:     General: No scleral icterus.       Right eye: No discharge.        Left eye: No discharge.     Extraocular Movements: Extraocular movements intact.     Conjunctiva/sclera: Conjunctivae normal.     Pupils: Pupils are equal, round, and reactive to light.  Neck:     Thyroid: No thyromegaly.      Vascular: No carotid bruit or JVD.     Trachea: Trachea normal.  Cardiovascular:     Rate and Rhythm: Normal rate and regular rhythm.     Pulses: Normal pulses.     Heart sounds: Normal heart sounds. No murmur heard.    No friction rub. No gallop.  Pulmonary:     Effort: Pulmonary effort is normal. No respiratory distress.     Breath sounds: Normal breath sounds. No wheezing.  Abdominal:     General: Bowel sounds are normal. There is no distension.     Palpations: Abdomen is soft.     Tenderness: There is no abdominal tenderness. There is no guarding.  Musculoskeletal:        General: Normal range of motion.     Cervical back: Normal range of motion and neck supple.  Lymphadenopathy:     Cervical: No cervical adenopathy.  Skin:    General: Skin is warm and dry.  Neurological:     Mental Status: She is alert and oriented to person, place, and time.     Cranial Nerves: No cranial nerve deficit.  Psychiatric:        Mood and Affect: Mood normal.        Behavior: Behavior normal.        Thought Content: Thought content normal.        Judgment: Judgment normal.      No results found for any visits on 07/29/23.     Assessment & Plan:    Routine Health Maintenance and Physical Exam  Immunization History  Administered Date(s) Administered   Influenza,inj,Quad PF,6+ Mos 07/08/2018, 06/30/2019, 07/24/2020, 07/24/2021, 07/25/2022   Influenza-Unspecified 08/06/2016, 07/08/2018, 06/30/2019, 07/24/2020   PFIZER(Purple Top)SARS-COV-2 Vaccination 11/24/2019, 12/15/2019   Td 01/23/2011   Tdap 07/24/2021   Zoster Recombinant(Shingrix) 07/08/2018    Health Maintenance  Topic Date Due   COVID-19 Vaccine (3 - Pfizer risk series) 01/12/2020   MAMMOGRAM  01/02/2023   INFLUENZA VACCINE  05/29/2023   Cervical Cancer Screening (HPV/Pap Cotest)  11/18/2023   DTaP/Tdap/Td (3 - Td or Tdap) 07/25/2031   Colonoscopy  12/28/2031   Hepatitis C Screening  Completed   HPV VACCINES  Aged Out    HIV Screening  Discontinued   Zoster Vaccines- Shingrix  Discontinued    Discussed health benefits of physical activity, and encouraged her to engage in regular exercise appropriate for her age and condition.  1. Annual physical exam Checking labs as below.  Up-to-date on preventative care.  Wellness information provided with AVS. - CBC with Differential/Platelet - CMP14+EGFR  2. Hyperlipidemia LDL goal <100 Checking labs as below.  Continue Lipitor 40 mg daily. - CMP14+EGFR - Lipid panel -  atorvastatin (LIPITOR) 40 MG tablet; Take 1 tablet (40 mg total) by mouth daily.  Dispense: 90 tablet; Refill: 3  3. Recurrent major depressive disorder, in full remission (HCC) Stable on current regimen.  Continue Lexapro and as needed alprazolam. - ALPRAZolam (XANAX) 0.5 MG tablet; Take 1/2-1 tablet (0.25-0.5 mg total) by mouth 2 (two) times daily as needed for anxiety.  Dispense: 30 tablet; Refill: 0 - escitalopram (LEXAPRO) 10 MG tablet; Take 1 tablet (10 mg total) by mouth daily.  Dispense: 90 tablet; Refill: 3  4. Essential hypertension, benign Blood pressure at goal.  Continue amlodipine-benazepril as prescribed. - amlodipine-benazepril (LOTREL) 2.5-10 MG capsule; Take 1 capsule by mouth daily.  Dispense: 90 capsule; Refill: 3  5. Gastroesophageal reflux disease, unspecified whether esophagitis present Stable.  Continue pantoprazole. - pantoprazole (PROTONIX) 40 MG tablet; Take 1 tablet (40 mg total) by mouth 2 (two) times daily.  Dispense: 180 tablet; Refill: 3  6. Encounter for screening mammogram for malignant neoplasm of breast Mammogram ordered. - MM DIGITAL SCREENING BILATERAL; Future  7. Need for influenza vaccination Flu vaccine given in office today. - Flu vaccine trivalent PF, 6mos and older(Flulaval,Afluria,Fluarix,Fluzone)  8. Thyroid disorder screen Checking TSH. - TSH  9. Family history of diabetes mellitus Checking A1c. - Hemoglobin A1c  10. History of CVA  (cerebrovascular accident) without residual deficits On Lipitor as noted above.  No current issues or residual effects of prior CVA. - atorvastatin (LIPITOR) 40 MG tablet; Take 1 tablet (40 mg total) by mouth daily.  Dispense: 90 tablet; Refill: 3  Return in about 6 months (around 01/27/2024) for chronic disease follow up.   Christen Butter, NP

## 2023-07-29 NOTE — Patient Instructions (Signed)
Preventive Care 40-60 Years Old, Female Preventive care refers to lifestyle choices and visits with your health care provider that can promote health and wellness. Preventive care visits are also called wellness exams. What can I expect for my preventive care visit? Counseling Your health care provider may ask you questions about your: Medical history, including: Past medical problems. Family medical history. Pregnancy history. Current health, including: Menstrual cycle. Method of birth control. Emotional well-being. Home life and relationship well-being. Sexual activity and sexual health. Lifestyle, including: Alcohol, nicotine or tobacco, and drug use. Access to firearms. Diet, exercise, and sleep habits. Work and work environment. Sunscreen use. Safety issues such as seatbelt and bike helmet use. Physical exam Your health care provider will check your: Height and weight. These may be used to calculate your BMI (body mass index). BMI is a measurement that tells if you are at a healthy weight. Waist circumference. This measures the distance around your waistline. This measurement also tells if you are at a healthy weight and may help predict your risk of certain diseases, such as type 2 diabetes and high blood pressure. Heart rate and blood pressure. Body temperature. Skin for abnormal spots. What immunizations do I need?  Vaccines are usually given at various ages, according to a schedule. Your health care provider will recommend vaccines for you based on your age, medical history, and lifestyle or other factors, such as travel or where you work. What tests do I need? Screening Your health care provider may recommend screening tests for certain conditions. This may include: Lipid and cholesterol levels. Diabetes screening. This is done by checking your blood sugar (glucose) after you have not eaten for a while (fasting). Pelvic exam and Pap test. Hepatitis B test. Hepatitis C  test. HIV (human immunodeficiency virus) test. STI (sexually transmitted infection) testing, if you are at risk. Lung cancer screening. Colorectal cancer screening. Mammogram. Talk with your health care provider about when you should start having regular mammograms. This may depend on whether you have a family history of breast cancer. BRCA-related cancer screening. This may be done if you have a family history of breast, ovarian, tubal, or peritoneal cancers. Bone density scan. This is done to screen for osteoporosis. Talk with your health care provider about your test results, treatment options, and if necessary, the need for more tests. Follow these instructions at home: Eating and drinking  Eat a diet that includes fresh fruits and vegetables, whole grains, lean protein, and low-fat dairy products. Take vitamin and mineral supplements as recommended by your health care provider. Do not drink alcohol if: Your health care provider tells you not to drink. You are pregnant, may be pregnant, or are planning to become pregnant. If you drink alcohol: Limit how much you have to 0-1 drink a day. Know how much alcohol is in your drink. In the [place] one drink equals one 12 oz bottle of beer (355 mL), one 5 oz glass of wine (148 mL), or one 1 oz glass of hard liquor (44 mL). Lifestyle Brush your teeth every morning and night with fluoride toothpaste. Floss one time each day. Exercise for at least 30 minutes 5 or more days each week. Do not use any products that contain nicotine or tobacco. These products include cigarettes, chewing tobacco, and vaping devices, such as e-cigarettes. If you need help quitting, ask your health care provider. Do not use drugs. If you are sexually active, practice safe sex. Use a condom or other form of protection to   prevent STIs. If you do not wish to become pregnant, use a form of birth control. If you plan to become pregnant, see your health care provider for a  prepregnancy visit. Take aspirin only as told by your health care provider. Make sure that you understand how much to take and what form to take. Work with your health care provider to find out whether it is safe and beneficial for you to take aspirin daily. Find healthy ways to manage stress, such as: Meditation, yoga, or listening to music. Journaling. Talking to a trusted person. Spending time with friends and family. Minimize exposure to UV radiation to reduce your risk of skin cancer. Safety Always wear your seat belt while driving or riding in a vehicle. Do not drive: If you have been drinking alcohol. Do not ride with someone who has been drinking. When you are tired or distracted. While texting. If you have been using any mind-altering substances or drugs. Wear a helmet and other protective equipment during sports activities. If you have firearms in your house, make sure you follow all gun safety procedures. Seek help if you have been physically or sexually abused. What's next? Visit your health care provider once a year for an annual wellness visit. Ask your health care provider how often you should have your eyes and teeth checked. Stay up to date on all vaccines. This information is not intended to replace advice given to you by your health care provider. Make sure you discuss any questions you have with your health care provider. Document Revised: 04/11/2021 Document Reviewed: 04/11/2021 Elsevier Patient Education  2024 Elsevier Inc.  

## 2023-07-30 ENCOUNTER — Other Ambulatory Visit: Payer: Self-pay

## 2023-07-30 ENCOUNTER — Encounter: Payer: Commercial Managed Care - PPO | Admitting: Medical-Surgical

## 2023-09-29 ENCOUNTER — Other Ambulatory Visit: Payer: Self-pay

## 2023-10-13 ENCOUNTER — Other Ambulatory Visit: Payer: Self-pay

## 2023-10-13 ENCOUNTER — Other Ambulatory Visit (HOSPITAL_COMMUNITY): Payer: Self-pay

## 2023-10-16 DIAGNOSIS — M9903 Segmental and somatic dysfunction of lumbar region: Secondary | ICD-10-CM | POA: Diagnosis not present

## 2023-10-16 DIAGNOSIS — R293 Abnormal posture: Secondary | ICD-10-CM | POA: Diagnosis not present

## 2023-10-16 DIAGNOSIS — M9905 Segmental and somatic dysfunction of pelvic region: Secondary | ICD-10-CM | POA: Diagnosis not present

## 2023-10-16 DIAGNOSIS — M9904 Segmental and somatic dysfunction of sacral region: Secondary | ICD-10-CM | POA: Diagnosis not present

## 2023-10-23 ENCOUNTER — Other Ambulatory Visit (HOSPITAL_COMMUNITY): Payer: Self-pay

## 2023-12-15 ENCOUNTER — Telehealth: Payer: Commercial Managed Care - PPO | Admitting: Physician Assistant

## 2023-12-15 DIAGNOSIS — J019 Acute sinusitis, unspecified: Secondary | ICD-10-CM

## 2023-12-15 MED ORDER — AMOXICILLIN-POT CLAVULANATE 875-125 MG PO TABS: 1.0000 | ORAL_TABLET | Freq: Two times a day (BID) | ORAL | 0 refills | Status: AC

## 2023-12-15 MED ORDER — CETIRIZINE HCL 10 MG PO TABS: 10.0000 mg | ORAL_TABLET | Freq: Every day | ORAL | 0 refills | Status: DC

## 2023-12-15 MED ORDER — FLUTICASONE PROPIONATE 50 MCG/ACT NA SUSP: 2.0000 | Freq: Every day | NASAL | 6 refills | Status: DC

## 2023-12-15 MED ORDER — BENZONATATE 100 MG PO CAPS: 100.0000 mg | ORAL_CAPSULE | Freq: Two times a day (BID) | ORAL | 0 refills | Status: DC | PRN

## 2023-12-15 NOTE — Progress Notes (Signed)

## 2023-12-15 NOTE — Progress Notes (Signed)
 I have spent 5 minutes in review of e-visit questionnaire, review and updating patient chart, medical decision making and response to patient.   Laure Kidney, PA-C

## 2023-12-31 DIAGNOSIS — D224 Melanocytic nevi of scalp and neck: Secondary | ICD-10-CM | POA: Diagnosis not present

## 2023-12-31 DIAGNOSIS — D235 Other benign neoplasm of skin of trunk: Secondary | ICD-10-CM | POA: Diagnosis not present

## 2023-12-31 DIAGNOSIS — L814 Other melanin hyperpigmentation: Secondary | ICD-10-CM | POA: Diagnosis not present

## 2023-12-31 DIAGNOSIS — D485 Neoplasm of uncertain behavior of skin: Secondary | ICD-10-CM | POA: Diagnosis not present

## 2023-12-31 DIAGNOSIS — L821 Other seborrheic keratosis: Secondary | ICD-10-CM | POA: Diagnosis not present

## 2023-12-31 DIAGNOSIS — L579 Skin changes due to chronic exposure to nonionizing radiation, unspecified: Secondary | ICD-10-CM | POA: Diagnosis not present

## 2023-12-31 DIAGNOSIS — L820 Inflamed seborrheic keratosis: Secondary | ICD-10-CM | POA: Diagnosis not present

## 2023-12-31 DIAGNOSIS — D225 Melanocytic nevi of trunk: Secondary | ICD-10-CM | POA: Diagnosis not present

## 2024-01-12 ENCOUNTER — Other Ambulatory Visit (HOSPITAL_COMMUNITY): Payer: Self-pay

## 2024-01-21 ENCOUNTER — Other Ambulatory Visit (HOSPITAL_COMMUNITY): Payer: Self-pay

## 2024-03-03 ENCOUNTER — Telehealth: Admitting: Physician Assistant

## 2024-03-03 ENCOUNTER — Other Ambulatory Visit (HOSPITAL_COMMUNITY): Payer: Self-pay

## 2024-03-03 ENCOUNTER — Other Ambulatory Visit: Payer: Self-pay

## 2024-03-03 DIAGNOSIS — J302 Other seasonal allergic rhinitis: Secondary | ICD-10-CM | POA: Diagnosis not present

## 2024-03-03 MED ORDER — CETIRIZINE HCL 10 MG PO TABS
10.0000 mg | ORAL_TABLET | Freq: Every day | ORAL | 0 refills | Status: DC
  Filled 2024-03-03: qty 30, 30d supply, fill #0

## 2024-03-03 MED ORDER — AZELASTINE HCL 0.1 % NA SOLN
1.0000 | Freq: Two times a day (BID) | NASAL | 0 refills | Status: DC
  Filled 2024-03-03: qty 30, 90d supply, fill #0

## 2024-03-03 MED ORDER — FLUTICASONE PROPIONATE 50 MCG/ACT NA SUSP
2.0000 | Freq: Every day | NASAL | 0 refills | Status: DC
  Filled 2024-03-03: qty 16, 30d supply, fill #0

## 2024-03-03 NOTE — Progress Notes (Signed)
 E visit for Allergic Rhinitis We are sorry that you are not feeling well.  Here is how we plan to help!  Based on what you have shared with me it looks like you have Allergic Rhinitis.  Rhinitis is when a reaction occurs that causes nasal congestion, runny nose, sneezing, and itching.  Most types of rhinitis are caused by an inflammation and are associated with symptoms in the eyes ears or throat. There are several types of rhinitis.  The most common are acute rhinitis, which is usually caused by a viral illness, allergic or seasonal rhinitis, and nonallergic or year-round rhinitis.  Nasal allergies occur certain times of the year.  Allergic rhinitis is caused when allergens in the air trigger the release of histamine in the body.  Histamine causes itching, swelling, and fluid to build up in the fragile linings of the nasal passages, sinuses and eyelids.  An itchy nose and clear discharge are common.  I recommend the following over the counter treatments: You should take a daily dose of antihistamine; I am prescribing Cetirizine  10mg  Take 1 tablet daily  I also would recommend a nasal spray: I am prescribing: Flonase  2 sprays into each nostril once daily and Azelastine  nasal spray Use 1 spray in each nostril twice daily Okay to use these two nasal sprays together   HOME CARE:  You can use an over-the-counter saline nasal spray as needed Avoid areas where there is heavy dust, mites, or molds Stay indoors on windy days during the pollen season Keep windows closed in home, at least in bedroom; use air conditioner. Use high-efficiency house air filter Keep windows closed in car, turn AC on re-circulate Avoid playing out with dog during pollen season  GET HELP RIGHT AWAY IF:  If your symptoms do not improve within 10 days You become short of breath You develop yellow or green discharge from your nose for over 3 days You have coughing fits  MAKE SURE YOU:  Understand these  instructions Will watch your condition Will get help right away if you are not doing well or get worse  Thank you for choosing an e-visit. Your e-visit answers were reviewed by a board certified advanced clinical practitioner to complete your personal care plan. Depending upon the condition, your plan could have included both over the counter or prescription medications. Please review your pharmacy choice. Be sure that the pharmacy you have chosen is open so that you can pick up your prescription now.  If there is a problem you may message your provider in MyChart to have the prescription routed to another pharmacy. Your safety is important to us . If you have drug allergies check your prescription carefully.  For the next 24 hours, you can use MyChart to ask questions about today's visit, request a non-urgent call back, or ask for a work or school excuse from your e-visit provider. You will get an email in the next two days asking about your experience. I hope that your e-visit has been valuable and will speed your recovery.       I have spent 5 minutes in review of e-visit questionnaire, review and updating patient chart, medical decision making and response to patient.   Angelia Kelp, PA-C

## 2024-03-30 ENCOUNTER — Other Ambulatory Visit (HOSPITAL_COMMUNITY): Payer: Self-pay

## 2024-04-21 ENCOUNTER — Other Ambulatory Visit: Payer: Self-pay

## 2024-06-03 ENCOUNTER — Telehealth: Payer: Self-pay

## 2024-06-03 NOTE — Telephone Encounter (Signed)
 I spoke with patient and she is going to schedule her mammogram with [facility].

## 2024-06-10 DIAGNOSIS — R92323 Mammographic fibroglandular density, bilateral breasts: Secondary | ICD-10-CM | POA: Diagnosis not present

## 2024-06-10 DIAGNOSIS — Z1231 Encounter for screening mammogram for malignant neoplasm of breast: Secondary | ICD-10-CM | POA: Diagnosis not present

## 2024-06-10 LAB — HM MAMMOGRAPHY

## 2024-06-15 ENCOUNTER — Encounter: Payer: Self-pay | Admitting: Medical-Surgical

## 2024-06-15 DIAGNOSIS — R928 Other abnormal and inconclusive findings on diagnostic imaging of breast: Secondary | ICD-10-CM

## 2024-06-24 ENCOUNTER — Ambulatory Visit
Admission: RE | Admit: 2024-06-24 | Discharge: 2024-06-24 | Disposition: A | Discharge status: Home / Self Care | Source: Ambulatory Visit | Attending: Medical-Surgical | Admitting: Medical-Surgical

## 2024-06-24 ENCOUNTER — Ambulatory Visit: Payer: Self-pay | Admitting: Medical-Surgical

## 2024-06-24 DIAGNOSIS — R928 Other abnormal and inconclusive findings on diagnostic imaging of breast: Secondary | ICD-10-CM

## 2024-06-24 DIAGNOSIS — N6002 Solitary cyst of left breast: Secondary | ICD-10-CM | POA: Diagnosis not present

## 2024-06-24 DIAGNOSIS — N6001 Solitary cyst of right breast: Secondary | ICD-10-CM | POA: Diagnosis not present

## 2024-07-08 ENCOUNTER — Other Ambulatory Visit (HOSPITAL_COMMUNITY): Payer: Self-pay

## 2024-07-29 ENCOUNTER — Encounter: Payer: Self-pay | Admitting: Medical-Surgical

## 2024-07-29 ENCOUNTER — Other Ambulatory Visit (HOSPITAL_COMMUNITY): Payer: Self-pay

## 2024-07-29 ENCOUNTER — Other Ambulatory Visit: Payer: Self-pay

## 2024-07-29 ENCOUNTER — Ambulatory Visit (INDEPENDENT_AMBULATORY_CARE_PROVIDER_SITE_OTHER): Admitting: Medical-Surgical

## 2024-07-29 ENCOUNTER — Other Ambulatory Visit (HOSPITAL_COMMUNITY)
Admission: RE | Admit: 2024-07-29 | Discharge: 2024-07-29 | Disposition: A | Discharge status: Home / Self Care | Source: Ambulatory Visit | Attending: Medical-Surgical | Admitting: Medical-Surgical

## 2024-07-29 VITALS — BP 138/81 | HR 62 | Resp 20 | Ht 65.0 in | Wt 198.0 lb

## 2024-07-29 DIAGNOSIS — E785 Hyperlipidemia, unspecified: Secondary | ICD-10-CM | POA: Diagnosis not present

## 2024-07-29 DIAGNOSIS — Z124 Encounter for screening for malignant neoplasm of cervix: Secondary | ICD-10-CM | POA: Insufficient documentation

## 2024-07-29 DIAGNOSIS — I10 Essential (primary) hypertension: Secondary | ICD-10-CM

## 2024-07-29 DIAGNOSIS — K219 Gastro-esophageal reflux disease without esophagitis: Secondary | ICD-10-CM | POA: Diagnosis not present

## 2024-07-29 DIAGNOSIS — Z23 Encounter for immunization: Secondary | ICD-10-CM | POA: Diagnosis not present

## 2024-07-29 DIAGNOSIS — Z1329 Encounter for screening for other suspected endocrine disorder: Secondary | ICD-10-CM | POA: Diagnosis not present

## 2024-07-29 DIAGNOSIS — Z8673 Personal history of transient ischemic attack (TIA), and cerebral infarction without residual deficits: Secondary | ICD-10-CM

## 2024-07-29 DIAGNOSIS — Z131 Encounter for screening for diabetes mellitus: Secondary | ICD-10-CM

## 2024-07-29 DIAGNOSIS — F3342 Major depressive disorder, recurrent, in full remission: Secondary | ICD-10-CM | POA: Diagnosis not present

## 2024-07-29 DIAGNOSIS — Z0000 Encounter for general adult medical examination without abnormal findings: Secondary | ICD-10-CM

## 2024-07-29 MED ORDER — PANTOPRAZOLE SODIUM 40 MG PO TBEC
40.0000 mg | DELAYED_RELEASE_TABLET | Freq: Two times a day (BID) | ORAL | 3 refills | Status: AC
  Filled 2024-07-29 – 2024-10-08 (×2): qty 180, 90d supply, fill #0

## 2024-07-29 MED ORDER — ESCITALOPRAM OXALATE 10 MG PO TABS
10.0000 mg | ORAL_TABLET | Freq: Every day | ORAL | 3 refills | Status: AC
  Filled 2024-07-29: qty 90, 90d supply, fill #0
  Filled 2024-10-25: qty 90, 90d supply, fill #1

## 2024-07-29 MED ORDER — ALPRAZOLAM 0.5 MG PO TABS
0.2500 mg | ORAL_TABLET | Freq: Two times a day (BID) | ORAL | 0 refills | Status: AC | PRN
  Filled 2024-07-29: qty 30, 15d supply, fill #0

## 2024-07-29 MED ORDER — AMLODIPINE BESY-BENAZEPRIL HCL 2.5-10 MG PO CAPS
1.0000 | ORAL_CAPSULE | Freq: Every day | ORAL | 3 refills | Status: AC
  Filled 2024-07-29 – 2024-10-08 (×2): qty 90, 90d supply, fill #0

## 2024-07-29 MED ORDER — ATORVASTATIN CALCIUM 40 MG PO TABS
40.0000 mg | ORAL_TABLET | Freq: Every day | ORAL | 3 refills | Status: AC
  Filled 2024-07-29 – 2024-10-08 (×2): qty 90, 90d supply, fill #0

## 2024-07-29 NOTE — Patient Instructions (Signed)
 Preventive Care 58-61 Years Old, Female  Preventive care refers to lifestyle choices and visits with your health care provider that can promote health and wellness. Preventive care visits are also called wellness exams.  What can I expect for my preventive care visit?  Counseling  Your health care provider may ask you questions about your:  Medical history, including:  Past medical problems.  Family medical history.  Pregnancy history.  Current health, including:  Menstrual cycle.  Method of birth control.  Emotional well-being.  Home life and relationship well-being.  Sexual activity and sexual health.  Lifestyle, including:  Alcohol, nicotine or tobacco, and drug use.  Access to firearms.  Diet, exercise, and sleep habits.  Work and work Astronomer.  Sunscreen use.  Safety issues such as seatbelt and bike helmet use.  Physical exam  Your health care provider will check your:  Height and weight. These may be used to calculate your BMI (body mass index). BMI is a measurement that tells if you are at a healthy weight.  Waist circumference. This measures the distance around your waistline. This measurement also tells if you are at a healthy weight and may help predict your risk of certain diseases, such as type 2 diabetes and high blood pressure.  Heart rate and blood pressure.  Body temperature.  Skin for abnormal spots.  What immunizations do I need?    Vaccines are usually given at various ages, according to a schedule. Your health care provider will recommend vaccines for you based on your age, medical history, and lifestyle or other factors, such as travel or where you work.  What tests do I need?  Screening  Your health care provider may recommend screening tests for certain conditions. This may include:  Lipid and cholesterol levels.  Diabetes screening. This is done by checking your blood sugar (glucose) after you have not eaten for a while (fasting).  Pelvic exam and Pap test.  Hepatitis B test.  Hepatitis C  test.  HIV (human immunodeficiency virus) test.  STI (sexually transmitted infection) testing, if you are at risk.  Lung cancer screening.  Colorectal cancer screening.  Mammogram. Talk with your health care provider about when you should start having regular mammograms. This may depend on whether you have a family history of breast cancer.  BRCA-related cancer screening. This may be done if you have a family history of breast, ovarian, tubal, or peritoneal cancers.  Bone density scan. This is done to screen for osteoporosis.  Talk with your health care provider about your test results, treatment options, and if necessary, the need for more tests.  Follow these instructions at home:  Eating and drinking    Eat a diet that includes fresh fruits and vegetables, whole grains, lean protein, and low-fat dairy products.  Take vitamin and mineral supplements as recommended by your health care provider.  Do not drink alcohol if:  Your health care provider tells you not to drink.  You are pregnant, may be pregnant, or are planning to become pregnant.  If you drink alcohol:  Limit how much you have to 0-1 drink a day.  Know how much alcohol is in your drink. In the [place] one drink equals one 12 oz bottle of beer (355 mL), one 5 oz glass of wine (148 mL), or one 1 oz glass of hard liquor (44 mL).  Lifestyle  Brush your teeth every morning and night with fluoride toothpaste. Floss one time each day.  Exercise for at least  30 minutes 5 or more days each week.  Do not use any products that contain nicotine or tobacco. These products include cigarettes, chewing tobacco, and vaping devices, such as e-cigarettes. If you need help quitting, ask your health care provider.  Do not use drugs.  If you are sexually active, practice safe sex. Use a condom or other form of protection to prevent STIs.  If you do not wish to become pregnant, use a form of birth control. If you plan to become pregnant, see your health care provider for a  prepregnancy visit.  Take aspirin only as told by your health care provider. Make sure that you understand how much to take and what form to take. Work with your health care provider to find out whether it is safe and beneficial for you to take aspirin daily.  Find healthy ways to manage stress, such as:  Meditation, yoga, or listening to music.  Journaling.  Talking to a trusted person.  Spending time with friends and family.  Minimize exposure to UV radiation to reduce your risk of skin cancer.  Safety  Always wear your seat belt while driving or riding in a vehicle.  Do not drive:  If you have been drinking alcohol. Do not ride with someone who has been drinking.  When you are tired or distracted.  While texting.  If you have been using any mind-altering substances or drugs.  Wear a helmet and other protective equipment during sports activities.  If you have firearms in your house, make sure you follow all gun safety procedures.  Seek help if you have been physically or sexually abused.  What's next?  Visit your health care provider once a year for an annual wellness visit.  Ask your health care provider how often you should have your eyes and teeth checked.  Stay up to date on all vaccines.  This information is not intended to replace advice given to you by your health care provider. Make sure you discuss any questions you have with your health care provider.  Document Revised: 04/11/2021 Document Reviewed: 04/11/2021  Elsevier Patient Education  2024 [employer].

## 2024-07-29 NOTE — Progress Notes (Signed)
 Complete physical exam  Patient: Margaret Stanton   DOB: [birth year]   62 y.o. Female  MRN: [id number]  Subjective:    Chief Complaint  Patient presents with   Annual Exam   Gynecologic Exam    Margaret Stanton is a 63 y.o. female who presents today for a complete physical exam. She reports consuming a general diet. Working with a Psychologist, educational doing cardio, strength training, and stretching. She generally feels well. She reports sleeping well. She does not have additional problems to discuss today.    Most recent fall risk assessment:    03/05/2023    8:40 AM  Fall Risk   Falls in the past year? 0  Number falls in past yr: 1  Injury with Fall? 0  Risk for fall due to : History of fall(s)  Follow up Falls evaluation completed     Most recent depression screenings:    07/29/2024   11:48 AM 03/05/2023    8:40 AM  PHQ 2/9 Scores  PHQ - 2 Score 0 0  PHQ- 9 Score 0     Vision:Within last year and Dental: No current dental problems and Receives regular dental care    Patient Care Team: Willo Mini, NP as PCP - General (Nurse Practitioner)   Outpatient Medications Prior to Visit  Medication Sig   [DISCONTINUED] ALPRAZolam  (XANAX ) 0.5 MG tablet Take 1/2-1 tablet (0.25-0.5 mg total) by mouth 2 (two) times daily as needed for anxiety.   [DISCONTINUED] amlodipine -benazepril  (LOTREL ) 2.5-10 MG capsule Take 1 capsule by mouth daily.   [DISCONTINUED] atorvastatin  (LIPITOR) 40 MG tablet Take 1 tablet (40 mg total) by mouth daily.   [DISCONTINUED] escitalopram  (LEXAPRO ) 10 MG tablet Take 1 tablet (10 mg total) by mouth daily.   [DISCONTINUED] pantoprazole  (PROTONIX ) 40 MG tablet Take 1 tablet (40 mg total) by mouth 2 (two) times daily.   [DISCONTINUED] azelastine  (ASTELIN ) 0.1 % nasal spray Place 1 spray into both nostrils 2 (two) times daily. Use in each nostril as directed   [DISCONTINUED] cetirizine  (ZYRTEC ) 10 MG tablet Take 1 tablet (10 mg total) by mouth daily.   [DISCONTINUED]  fluticasone  (FLONASE ) 50 MCG/ACT nasal spray Place 2 sprays into both nostrils daily.   No facility-administered medications prior to visit.   Review of Systems  Constitutional:  Negative for chills, fever, malaise/fatigue and weight loss.  HENT:  Negative for congestion, ear pain, hearing loss, sinus pain and sore throat.   Eyes:  Negative for blurred vision, photophobia and pain.  Respiratory:  Negative for cough, shortness of breath and wheezing.   Cardiovascular:  Negative for chest pain, palpitations and leg swelling.  Gastrointestinal:  Negative for abdominal pain, constipation, diarrhea, heartburn, nausea and vomiting.  Genitourinary:  Negative for dysuria, frequency and urgency.  Musculoskeletal:  Negative for falls and neck pain.  Skin:  Negative for itching and rash.  Neurological:  Negative for dizziness, weakness and headaches.  Endo/Heme/Allergies:  Negative for polydipsia. Does not bruise/bleed easily.  Psychiatric/Behavioral:  Negative for depression, substance abuse and suicidal ideas. The patient is not nervous/anxious.      Objective:    BP 138/81 (BP Location: Left Arm, Cuff Size: Normal)   Pulse 62   Resp 20   Ht 5' 5 (1.651 m)   Wt 198 lb (89.8 kg)   SpO2 100%   BMI 32.95 kg/m    Physical Exam Vitals and nursing note reviewed. Exam conducted with a chaperone present.  Constitutional:      General:  She is not in acute distress.    Appearance: Normal appearance. She is not ill-appearing.  HENT:     Head: Normocephalic and atraumatic.     Right Ear: Tympanic membrane, ear canal and external ear normal. There is no impacted cerumen.     Left Ear: Tympanic membrane, ear canal and external ear normal. There is no impacted cerumen.     Nose: Nose normal. No congestion or rhinorrhea.     Right Turbinates: Not swollen.     Left Turbinates: Not swollen.     Right Sinus: No maxillary sinus tenderness or frontal sinus tenderness.     Left Sinus: No maxillary sinus  tenderness or frontal sinus tenderness.     Mouth/Throat:     Mouth: Mucous membranes are moist. No oral lesions.     Pharynx: No oropharyngeal exudate or posterior oropharyngeal erythema.     Tonsils: No tonsillar exudate or tonsillar abscesses.  Eyes:     General:        Right eye: No discharge.        Left eye: No discharge.     Extraocular Movements: Extraocular movements intact.     Conjunctiva/sclera: Conjunctivae normal.     Pupils: Pupils are equal, round, and reactive to light.  Neck:     Thyroid : No thyromegaly.     Vascular: No carotid bruit.  Cardiovascular:     Rate and Rhythm: Normal rate and regular rhythm.     Pulses: Normal pulses.     Heart sounds: Normal heart sounds. No murmur heard.    No friction rub. No gallop.  Pulmonary:     Effort: Pulmonary effort is normal. No respiratory distress.     Breath sounds: Normal breath sounds. No decreased breath sounds or wheezing.  Chest:  Breasts:    Breasts are symmetrical.     Right: Normal.     Left: Normal.  Abdominal:     General: Bowel sounds are normal. There is no distension.     Palpations: Abdomen is soft. There is no hepatomegaly, splenomegaly or mass.     Tenderness: There is no abdominal tenderness. There is no right CVA tenderness, left CVA tenderness, guarding or rebound.  Genitourinary:    General: Normal vulva.     Exam position: Lithotomy position.     Pubic Area: No rash.      Labia:        Right: No rash, tenderness, lesion or injury.        Left: No rash, tenderness, lesion or injury.      Vagina: Normal. No vaginal discharge.     Cervix: Normal. No friability.     Uterus: Normal. Not tender and no uterine prolapse.      Adnexa: Right adnexa normal and left adnexa normal.       Right: No mass or tenderness.         Left: No mass or tenderness.    Musculoskeletal:        General: No swelling or tenderness. Normal range of motion.     Cervical back: Normal range of motion and neck supple. No  tenderness.     Right lower leg: No edema.     Left lower leg: No edema.  Lymphadenopathy:     Cervical: No cervical adenopathy.  Skin:    General: Skin is warm and dry.     Coloration: Skin is not pale.     Findings: No rash.  Neurological:  Mental Status: She is alert and oriented to person, place, and time.     Sensory: No sensory deficit.     Motor: No weakness.     Gait: Gait normal.     Deep Tendon Reflexes: Reflexes are normal and symmetric.  Psychiatric:        Attention and Perception: Attention normal.        Mood and Affect: Mood and affect normal.        Speech: Speech normal.        Behavior: Behavior normal.        Thought Content: Thought content normal.        Cognition and Memory: Cognition normal.        Judgment: Judgment normal.      No results found for any visits on 07/29/24.     Assessment & Plan:    Routine Health Maintenance and Physical Exam  Immunization History  Administered Date(s) Administered   Influenza, Seasonal, Injecte, Preservative Fre 07/29/2023, 07/29/2024   Influenza,inj,Quad PF,6+ Mos 07/08/2018, 06/30/2019, 07/24/2020, 07/24/2021, 07/25/2022   Influenza-Unspecified 08/06/2016, 07/08/2018, 06/30/2019, 07/24/2020   PFIZER(Purple Top)SARS-COV-2 Vaccination 11/24/2019, 12/15/2019   Td 01/23/2011   Tdap 07/24/2021   Zoster Recombinant(Shingrix ) 07/08/2018    Health Maintenance  Topic Date Due   Pneumococcal Vaccine: 50+ Years (1 of 1 - PCV) Never done   Cervical Cancer Screening (HPV/Pap Cotest)  11/18/2023   COVID-19 Vaccine (3 - 2025-26 season) 08/14/2024 (Originally 06/28/2024)   Mammogram  06/24/2025   DTaP/Tdap/Td (3 - Td or Tdap) 07/25/2031   Colonoscopy  12/28/2031   Influenza Vaccine  Completed   Hepatitis C Screening  Completed   Hepatitis B Vaccines 19-59 Average Risk  Aged Out   HPV VACCINES  Aged Out   Meningococcal B Vaccine  Aged Out   HIV Screening  Discontinued   Zoster Vaccines- Shingrix   Discontinued     Discussed health benefits of physical activity, and encouraged her to engage in regular exercise appropriate for her age and condition.  1. Annual physical exam (Primary) Checking labs as below. UTD on preventative care. Wellness information provided with AVS. - CBC - CMP14+EGFR - Lipid panel  2. Essential hypertension, benign Blood pressure at goal.  Continue amlodipine -benazepril  1 tablet daily as prescribed.  Checking labs as below. - amlodipine -benazepril  (LOTREL ) 2.5-10 MG capsule; Take 1 capsule by mouth daily.  Dispense: 90 capsule; Refill: 3 - CBC - CMP14+EGFR - Lipid panel  3. Gastroesophageal reflux disease, unspecified whether esophagitis present Symptoms well-managed.  Continue pantoprazole . - pantoprazole  (PROTONIX ) 40 MG tablet; Take 1 tablet (40 mg total) by mouth 2 (two) times daily.  Dispense: 180 tablet; Refill: 3  4. Hyperlipidemia LDL goal <100 Continue atorvastatin .  Checking labs. - atorvastatin  (LIPITOR) 40 MG tablet; Take 1 tablet (40 mg total) by mouth daily.  Dispense: 90 tablet; Refill: 3 - CBC - CMP14+EGFR - Lipid panel  5. Recurrent major depressive disorder, in full remission Symptoms stable and well-managed.  Continue Lexapro  10 mg daily.  Continue very sparing use of Xanax  for severe anxiety not responsive to other self soothing methods. - ALPRAZolam  (XANAX ) 0.5 MG tablet; Take 1/2-1 tablet (0.25-0.5 mg total) by mouth 2 (two) times daily as needed for anxiety.  Dispense: 30 tablet; Refill: 0 - escitalopram  (LEXAPRO ) 10 MG tablet; Take 1 tablet (10 mg total) by mouth daily.  Dispense: 90 tablet; Refill: 3  6. Need for influenza vaccination Flu vaccine given in office today.   - Flu vaccine trivalent  PF, 6mos and older(Flulaval,Afluria,Fluarix,Fluzone)  7. History of CVA (cerebrovascular accident) without residual deficits Continue Lipitor. - atorvastatin  (LIPITOR) 40 MG tablet; Take 1 tablet (40 mg total) by mouth daily.  Dispense: 90 tablet;  Refill: 3  8. Diabetes mellitus screening Checking hemoglobin A1c. - Hemoglobin A1c  9. Thyroid  disorder screen Checking TSH. - TSH  10.  Cervical cancer screening Pap smear with HPV cotesting completed today.   Return in about 6 months (around 01/27/2025) for HTN follow up.     Treina Arscott, NP

## 2024-07-30 ENCOUNTER — Ambulatory Visit: Payer: Self-pay | Admitting: Medical-Surgical

## 2024-07-30 LAB — CMP14+EGFR
ALT: 26 IU/L (ref 0–32)
AST: 32 IU/L (ref 0–40)
Albumin: 4.5 g/dL (ref 3.9–4.9)
Alkaline Phosphatase: 116 IU/L (ref 49–135)
BUN/Creatinine Ratio: 19 (ref 12–28)
BUN: 16 mg/dL (ref 8–27)
Bilirubin Total: 0.5 mg/dL (ref 0.0–1.2)
CO2: 23 mmol/L (ref 20–29)
Calcium: 8.8 mg/dL (ref 8.7–10.3)
Chloride: 101 mmol/L (ref 96–106)
Creatinine, Ser: 0.86 mg/dL (ref 0.57–1.00)
Globulin, Total: 2.6 g/dL (ref 1.5–4.5)
Glucose: 85 mg/dL (ref 70–99)
Potassium: 4.4 mmol/L (ref 3.5–5.2)
Sodium: 140 mmol/L (ref 134–144)
Total Protein: 7.1 g/dL (ref 6.0–8.5)
eGFR: 77 mL/min/1.73 (ref >59)

## 2024-07-30 LAB — CBC
Hematocrit: 31.6 % — ABNORMAL LOW (ref 34.0–46.6)
Hemoglobin: 10.6 g/dL — ABNORMAL LOW (ref 11.1–15.9)
MCH: 31.8 pg (ref 26.6–33.0)
MCHC: 33.5 g/dL (ref 31.5–35.7)
MCV: 95 fL (ref 79–97)
Platelets: 210 x10E3/uL (ref 150–450)
RBC: 3.33 x10E6/uL — ABNORMAL LOW (ref 3.77–5.28)
RDW: 12.8 % (ref 11.7–15.4)
WBC: 5.3 x10E3/uL (ref 3.4–10.8)

## 2024-07-30 LAB — LIPID PANEL
Chol/HDL Ratio: 2.2 ratio (ref 0.0–4.4)
Cholesterol, Total: 163 mg/dL (ref 100–199)
HDL: 74 mg/dL (ref >39)
LDL Chol Calc (NIH): 77 mg/dL (ref 0–99)
Triglycerides: 60 mg/dL (ref 0–149)
VLDL Cholesterol Cal: 12 mg/dL (ref 5–40)

## 2024-07-30 LAB — TSH: TSH: 1.92 u[IU]/mL (ref 0.450–4.500)

## 2024-07-30 LAB — HEMOGLOBIN A1C
Est. average glucose Bld gHb Est-mCnc: 114 mg/dL
Hgb A1c MFr Bld: 5.6 % (ref 4.8–5.6)

## 2024-08-02 LAB — CYTOLOGY - PAP
Comment: NEGATIVE
Diagnosis: NEGATIVE
High risk HPV: NEGATIVE

## 2024-10-08 ENCOUNTER — Other Ambulatory Visit: Payer: Self-pay

## 2024-10-08 ENCOUNTER — Other Ambulatory Visit (HOSPITAL_COMMUNITY): Payer: Self-pay

## 2024-10-25 ENCOUNTER — Other Ambulatory Visit (HOSPITAL_COMMUNITY): Payer: Self-pay
